# Patient Record
Sex: Female | Born: 1990 | Race: Black or African American | Hispanic: No | Marital: Single | Smoking: Former smoker
Health system: Southern US, Community
[De-identification: ages and names within clinical notes are randomized; demographics above are authoritative.]

## PROBLEM LIST (undated history)

## (undated) DIAGNOSIS — H5440 Blindness, one eye, unspecified eye: Secondary | ICD-10-CM

## (undated) DIAGNOSIS — J45909 Unspecified asthma, uncomplicated: Secondary | ICD-10-CM

## (undated) DIAGNOSIS — H53009 Unspecified amblyopia, unspecified eye: Secondary | ICD-10-CM

## (undated) HISTORY — PX: OTHER SURGICAL HISTORY: SHX169

## (undated) HISTORY — PX: EYE SURGERY: SHX253

---
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
## 2009-05-11 ENCOUNTER — Emergency Department (HOSPITAL_BASED_OUTPATIENT_CLINIC_OR_DEPARTMENT_OTHER): Admission: EM | Admit: 2009-05-11 | Discharge: 2009-05-11 | Payer: Self-pay | Admitting: Emergency Medicine

## 2009-09-17 ENCOUNTER — Emergency Department (HOSPITAL_BASED_OUTPATIENT_CLINIC_OR_DEPARTMENT_OTHER): Admission: EM | Admit: 2009-09-17 | Discharge: 2009-09-17 | Payer: Self-pay | Admitting: Emergency Medicine

## 2009-09-17 ENCOUNTER — Ambulatory Visit: Payer: Self-pay | Admitting: Diagnostic Radiology

## 2009-10-07 ENCOUNTER — Emergency Department (HOSPITAL_COMMUNITY): Admission: EM | Admit: 2009-10-07 | Discharge: 2009-10-07 | Payer: Self-pay | Admitting: Emergency Medicine

## 2009-10-08 ENCOUNTER — Emergency Department (HOSPITAL_BASED_OUTPATIENT_CLINIC_OR_DEPARTMENT_OTHER): Admission: EM | Admit: 2009-10-08 | Discharge: 2009-10-08 | Payer: Self-pay | Admitting: Emergency Medicine

## 2010-01-22 ENCOUNTER — Other Ambulatory Visit: Payer: Self-pay | Admitting: Emergency Medicine

## 2010-01-22 ENCOUNTER — Inpatient Hospital Stay (HOSPITAL_COMMUNITY): Admission: AD | Admit: 2010-01-22 | Discharge: 2010-01-22 | Payer: Self-pay | Admitting: Obstetrics and Gynecology

## 2010-03-14 ENCOUNTER — Emergency Department (HOSPITAL_BASED_OUTPATIENT_CLINIC_OR_DEPARTMENT_OTHER): Admission: EM | Admit: 2010-03-14 | Discharge: 2010-03-14 | Payer: Self-pay | Admitting: Emergency Medicine

## 2010-03-19 IMAGING — US US OB TRANSVAGINAL MODIFY
1 series · 14 of 28 positions shown · non-contrast
Comparison: None.

CLINICAL DATA: Vaginal bleeding for the past day.  6 weeks and 1
day pregnant by last menstrual period.  Positive pregnancy test.
Quantitative beta HCG 13.

OBSTETRIC <14 WK US AND TRANSVAGINAL OB US
TECHNIQUE: Both transabdominal and transvaginal ultrasound
examinations were performed for complete evaluation of the
gestation as well as the maternal uterus, adnexal regions, and
pelvic cul-de-sac.

[Series 1: us ob transvaginal modify · 0.26mm/px · 14 of 29 slices shown]
[im 2/29]
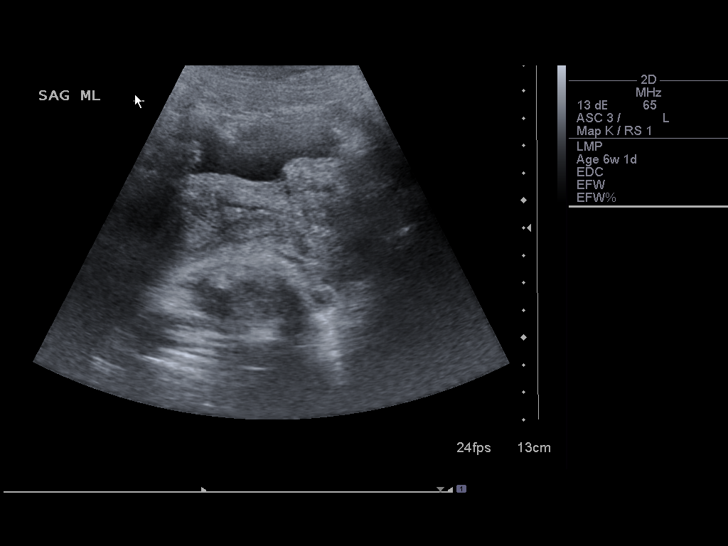
[im 4/29]
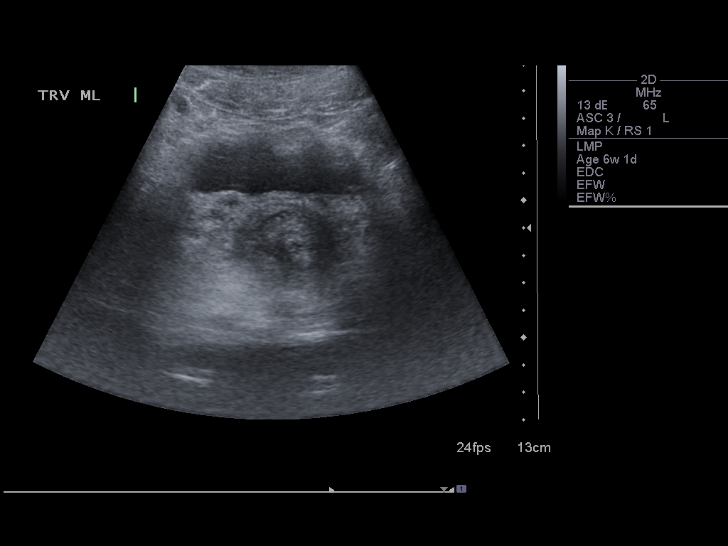
[im 6/29]
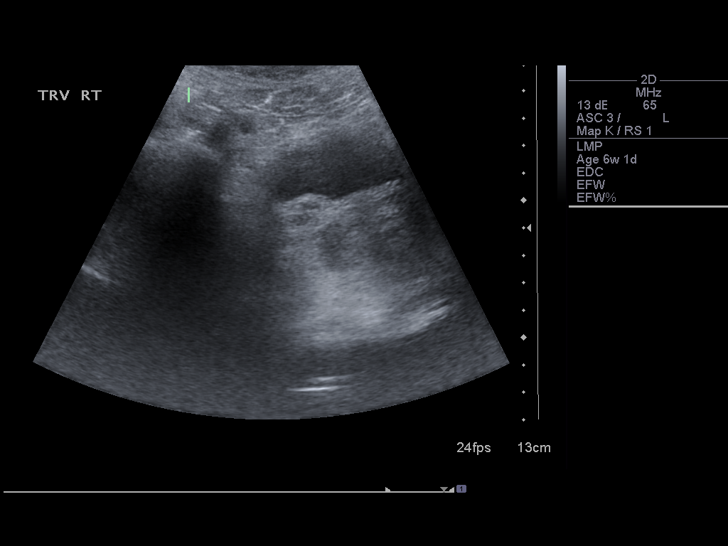
[im 8/29]
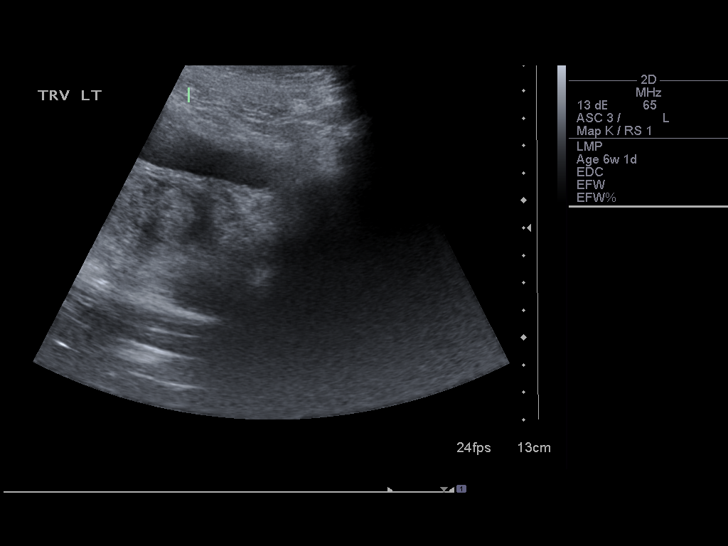
[im 10/29]
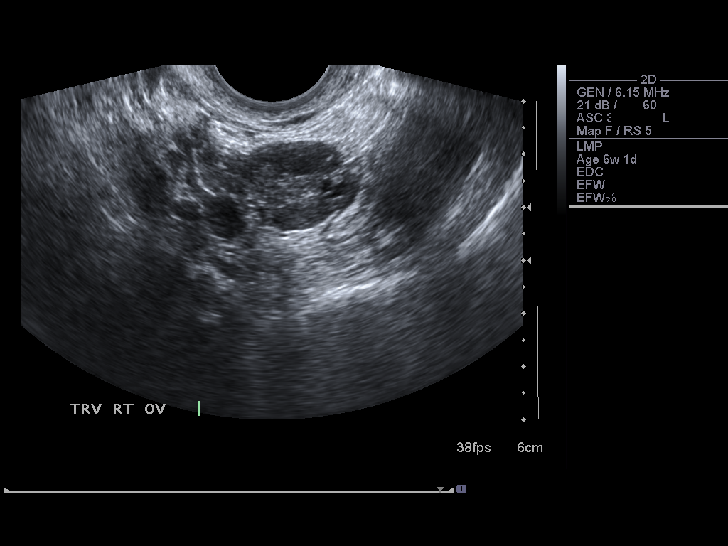
[im 12/29]
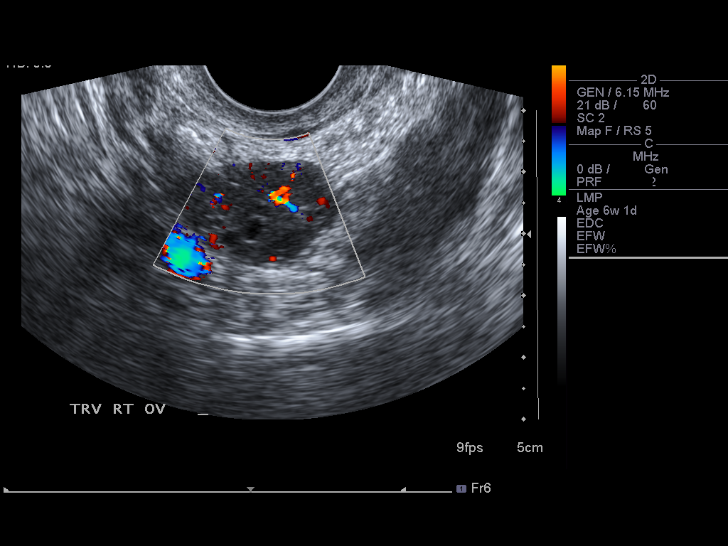
[im 14/29]
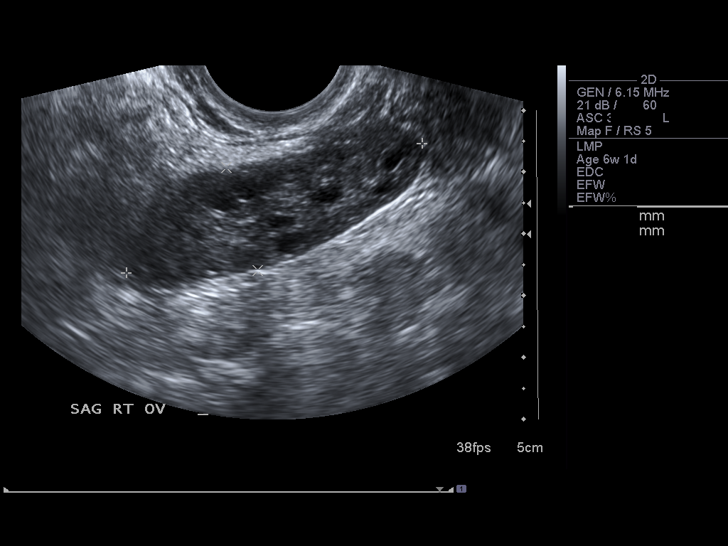
[im 16/29]
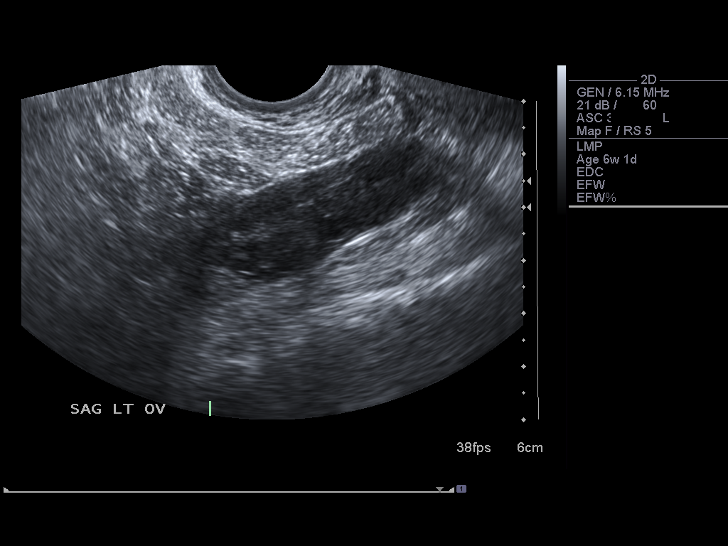
[im 18/29]
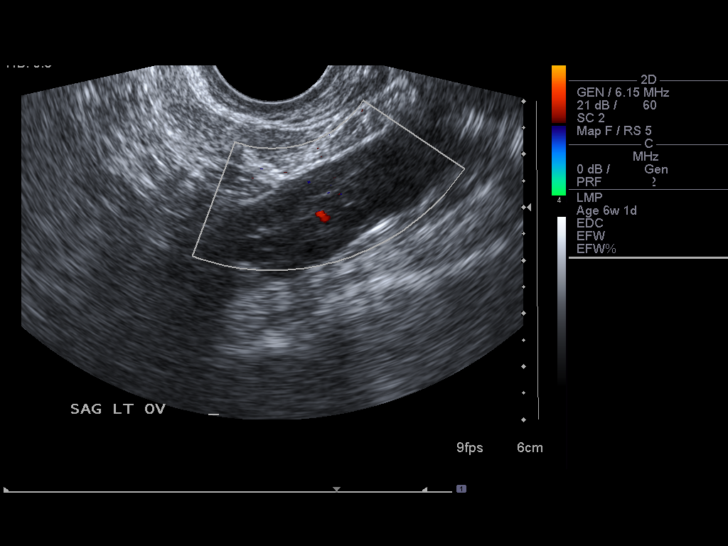
[im 20/29]
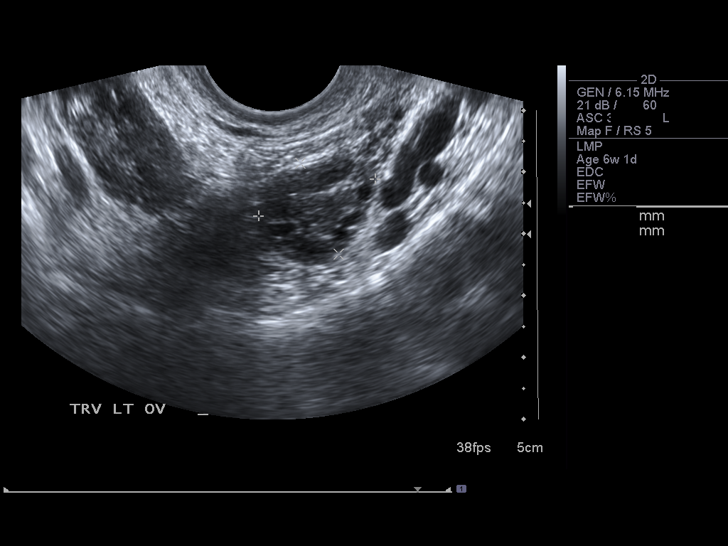
[im 22/29]
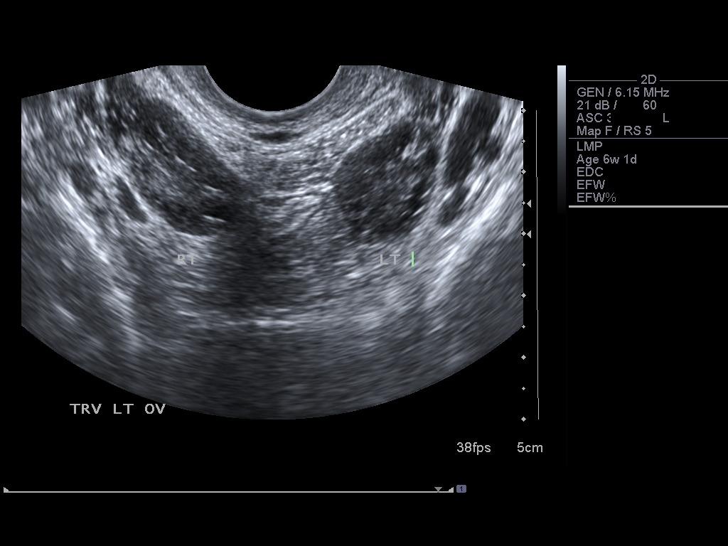
[im 24/29]
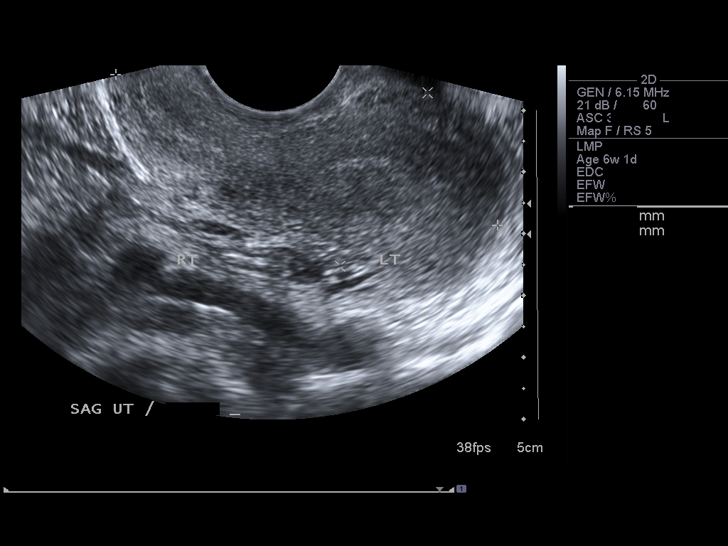
[im 26/29]
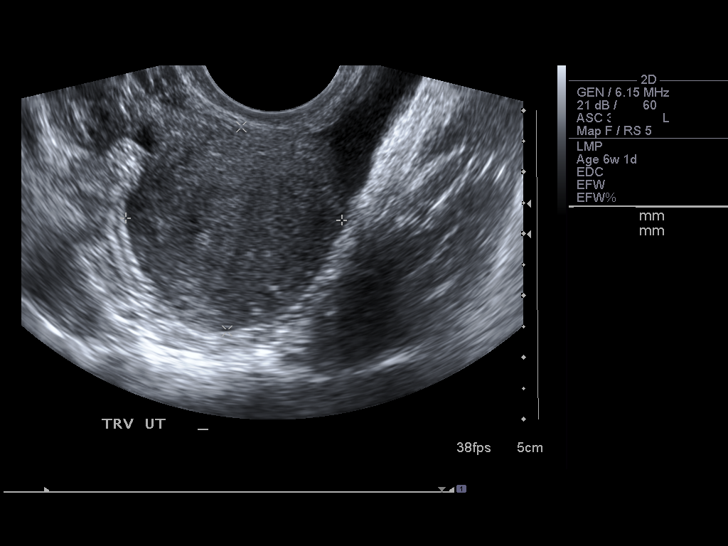
[im 29/29]
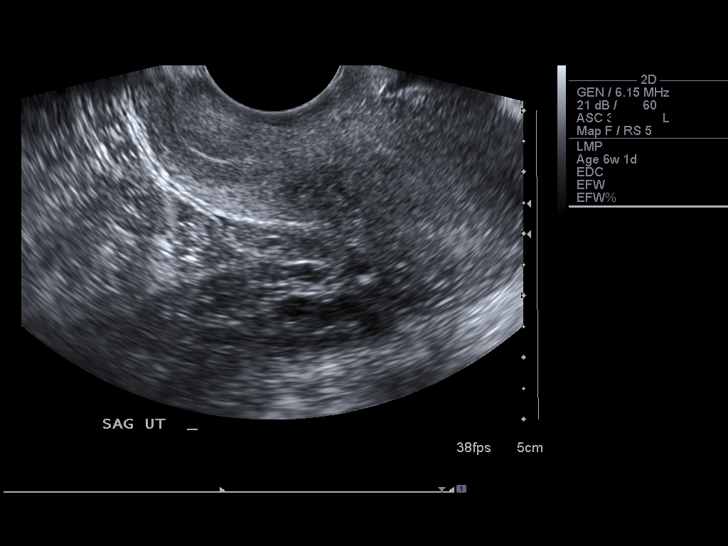

[14 of 28 positions shown; findings below may reference images not displayed]

FINDINGS: Normal appearing uterus with a normal appearing
endometrial stripe measuring 2.1 mm in maximum thickness
transvaginally.  No intrauterine or extrauterine gestational sac
seen.  Normal appearing ovaries, containing follicles.  No free
peritoneal fluid.
IMPRESSION: Normal pelvic ultrasound with no intrauterine gestation or ectopic
pregnancy seen.

## 2010-10-01 LAB — PREGNANCY, URINE: Preg Test, Ur: NEGATIVE

## 2010-10-01 LAB — URINE MICROSCOPIC-ADD ON

## 2010-10-01 LAB — URINALYSIS, ROUTINE W REFLEX MICROSCOPIC
Leukocytes, UA: NEGATIVE
Specific Gravity, Urine: 1.026 (ref 1.005–1.030)
Urobilinogen, UA: 0.2 mg/dL (ref 0.0–1.0)
pH: 6 (ref 5.0–8.0)

## 2010-10-05 ENCOUNTER — Emergency Department (HOSPITAL_BASED_OUTPATIENT_CLINIC_OR_DEPARTMENT_OTHER)
Admission: EM | Admit: 2010-10-05 | Discharge: 2010-10-05 | Disposition: A | Discharge status: Home / Self Care | Payer: Commercial Managed Care - PPO | Attending: Emergency Medicine | Admitting: Emergency Medicine

## 2010-10-05 DIAGNOSIS — F17200 Nicotine dependence, unspecified, uncomplicated: Secondary | ICD-10-CM | POA: Insufficient documentation

## 2010-10-05 DIAGNOSIS — J45909 Unspecified asthma, uncomplicated: Secondary | ICD-10-CM | POA: Insufficient documentation

## 2010-10-05 DIAGNOSIS — J029 Acute pharyngitis, unspecified: Secondary | ICD-10-CM | POA: Insufficient documentation

## 2010-10-05 LAB — RAPID STREP SCREEN (MED CTR MEBANE ONLY): Streptococcus, Group A Screen (Direct): NEGATIVE

## 2010-10-09 LAB — HERPES SIMPLEX VIRUS CULTURE: Culture: NOT DETECTED

## 2010-10-19 LAB — DIFFERENTIAL
Basophils Relative: 1 % (ref 0–1)
Eosinophils Absolute: 0.1 10*3/uL (ref 0.0–0.7)
Eosinophils Relative: 1 % (ref 0–5)
Monocytes Relative: 8 % (ref 3–12)
Neutrophils Relative %: 61 % (ref 43–77)

## 2010-10-19 LAB — CBC
Hemoglobin: 11.9 g/dL — ABNORMAL LOW (ref 12.0–15.0)
MCHC: 34 g/dL (ref 30.0–36.0)
Platelets: 425 10*3/uL — ABNORMAL HIGH (ref 150–400)
WBC: 10.5 10*3/uL (ref 4.0–10.5)

## 2010-10-19 LAB — BASIC METABOLIC PANEL
BUN: 11 mg/dL (ref 6–23)
GFR calc Af Amer: >60 mL/min (ref >60)
Sodium: 137 mEq/L (ref 135–145)

## 2010-10-25 LAB — URINE MICROSCOPIC-ADD ON

## 2010-10-25 LAB — CBC
HCT: 34.7 % — ABNORMAL LOW (ref 36.0–46.0)
MCHC: 33.7 g/dL (ref 30.0–36.0)
Platelets: 359 10*3/uL (ref 150–400)
WBC: 6.4 10*3/uL (ref 4.0–10.5)

## 2010-10-25 LAB — URINALYSIS, ROUTINE W REFLEX MICROSCOPIC
Glucose, UA: NEGATIVE mg/dL
Leukocytes, UA: NEGATIVE
Protein, ur: NEGATIVE mg/dL
Specific Gravity, Urine: 1.027 (ref 1.005–1.030)
pH: 7.5 (ref 5.0–8.0)

## 2010-10-25 LAB — DIFFERENTIAL
Basophils Absolute: 0 10*3/uL (ref 0.0–0.1)
Monocytes Relative: 8 % (ref 3–12)
Neutro Abs: 2.7 10*3/uL (ref 1.7–7.7)
Neutrophils Relative %: 43 % (ref 43–77)

## 2010-10-25 LAB — ABO/RH: ABO/RH(D): O POS

## 2010-10-25 LAB — HCG, QUANTITATIVE, PREGNANCY: hCG, Beta Chain, Quant, S: 13 m[IU]/mL — ABNORMAL HIGH (ref <5)

## 2010-10-25 LAB — WET PREP, GENITAL

## 2010-11-03 ENCOUNTER — Emergency Department (HOSPITAL_COMMUNITY): Payer: Commercial Managed Care - PPO

## 2010-11-03 ENCOUNTER — Emergency Department (HOSPITAL_COMMUNITY)
Admission: EM | Admit: 2010-11-03 | Discharge: 2010-11-03 | Disposition: A | Discharge status: Home / Self Care | Payer: Commercial Managed Care - PPO | Attending: Emergency Medicine | Admitting: Emergency Medicine

## 2010-11-03 DIAGNOSIS — Y92009 Unspecified place in unspecified non-institutional (private) residence as the place of occurrence of the external cause: Secondary | ICD-10-CM | POA: Insufficient documentation

## 2010-11-03 DIAGNOSIS — R51 Headache: Secondary | ICD-10-CM | POA: Insufficient documentation

## 2010-11-03 DIAGNOSIS — T07XXXA Unspecified multiple injuries, initial encounter: Secondary | ICD-10-CM | POA: Insufficient documentation

## 2010-11-03 DIAGNOSIS — M542 Cervicalgia: Secondary | ICD-10-CM | POA: Insufficient documentation

## 2010-11-03 DIAGNOSIS — R6884 Jaw pain: Secondary | ICD-10-CM | POA: Insufficient documentation

## 2010-11-03 DIAGNOSIS — R42 Dizziness and giddiness: Secondary | ICD-10-CM | POA: Insufficient documentation

## 2010-11-03 DIAGNOSIS — R229 Localized swelling, mass and lump, unspecified: Secondary | ICD-10-CM | POA: Insufficient documentation

## 2010-11-03 MED ORDER — IOHEXOL 300 MG/ML  SOLN
100.0000 mL | Freq: Once | INTRAMUSCULAR | Status: AC | PRN
  Administered 2010-11-03: 100 mL via INTRAVENOUS

## 2010-11-12 ENCOUNTER — Emergency Department (HOSPITAL_BASED_OUTPATIENT_CLINIC_OR_DEPARTMENT_OTHER)
Admission: EM | Admit: 2010-11-12 | Discharge: 2010-11-12 | Disposition: A | Discharge status: Home / Self Care | Payer: Commercial Managed Care - PPO | Attending: Emergency Medicine | Admitting: Emergency Medicine

## 2010-11-12 DIAGNOSIS — X58XXXA Exposure to other specified factors, initial encounter: Secondary | ICD-10-CM | POA: Insufficient documentation

## 2010-11-12 DIAGNOSIS — S20229A Contusion of unspecified back wall of thorax, initial encounter: Secondary | ICD-10-CM | POA: Insufficient documentation

## 2010-11-12 DIAGNOSIS — F17200 Nicotine dependence, unspecified, uncomplicated: Secondary | ICD-10-CM | POA: Insufficient documentation

## 2010-11-12 DIAGNOSIS — Y92009 Unspecified place in unspecified non-institutional (private) residence as the place of occurrence of the external cause: Secondary | ICD-10-CM | POA: Insufficient documentation

## 2010-12-01 NOTE — Op Note (Signed)
Cindy Arellano, Cindy Arellano                            ACCOUNT NO.:  [id number]   MEDICAL RECORD NO.:  [id number]                   PATIENT TYPE:  AMB   LOCATION:  [facility]SC                                  FACILITY:  [facility]   PHYSICIAN:  Pasty Spillers. Maple Hudson, M.D.              DATE OF BIRTH:  1991/02/12   DATE OF PROCEDURE:  07/02/2003  DATE OF DISCHARGE:                                 OPERATIVE REPORT   PREOPERATIVE DIAGNOSIS:  A pattern esotropia.   POSTOPERATIVE DIAGNOSIS:  A pattern esotropia.   PROCEDURE:  1. Right medial rectus muscle recession, 5.5 mm.  2. Superior oblique tenectomy, OU.   SURGEON:  Pasty Spillers. Maple Hudson, M.D.   ANESTHESIA:  General (laryngeal mask).   COMPLICATIONS:  None.   DESCRIPTION OF PROCEDURE:  After routine preoperative evaluation  including  informed consent from the mother, the patient was taken to the operating  room where she was identified  by me. General anesthesia was induced without  difficulty after placement  of appropriate monitors. The patient was prepped  and draped in the standard sterile fashion. A lid speculum was placed in the  right eye.   Exaggerated forced traction testing was carried out, confirming marked  tightness of the superior  oblique tendon. Through a superonasal fornix  incision through conjunctiva and Tenon's fascia, the right superior  rectus  muscle was engaged on a series of muscle hooks. A Desmarres retractor was  placed through the conjunctival incision and drawn posteriorly  to expose  the superior  oblique tendon, which was identified  along the nasal border  of the superior  rectus muscle and engaged on an oblique hook.   The tendon was spread between  2 oblique hooks, and a 4-mm section of the  tendon was excised with Westcott scissors. Force traction testing was  repeated and was completely free. The conjunctival incision was closed with  2 interrupted 6-0 Vicryl sutures.   Through an inferonasal fornix incision through  conjunctiva and Tenon's  fascia, the right  medial rectus muscle was engaged on a series of muscle  hooks and carefully cleared of its fascial attachment. The tendon was  secured with a double-armed 6-0 Vicryl suture with a double-locking bite at  each border of the muscle, 1 mm from the insertion.   The muscle was disinserted and was reattached to the sclera at a measured of  5.5 mm posterior  to the original insertion, using direct scleral passes in  crossed-swords fashion. The suture ends were tied securely after the  position of the muscle had been checked and found to be accurate. The  conjunctiva was closed with 2 interrupted 6-0 Vicryl sutures.   The lid speculum was transferred to the left eye, where a superior oblique  tenectomy was done just as described for the right, again confirming  negative forced ductions following  the tenectomy. No other muscle was  operated on the left eye. TobraDex ophthalmic ointment was placed in each  eye.   The patient was awakened without difficulty and taken to the recovery room  in stable condition. She suffered no intraoperative or immediate  postoperative complications.                                               Pasty Spillers. Maple Hudson, M.D.    Cheron Schaumann  D:  07/02/2003  T:  07/04/2003  Job:  [id number]

## 2010-12-06 ENCOUNTER — Emergency Department (HOSPITAL_BASED_OUTPATIENT_CLINIC_OR_DEPARTMENT_OTHER)
Admission: EM | Admit: 2010-12-06 | Discharge: 2010-12-07 | Disposition: A | Discharge status: Home / Self Care | Payer: Worker's Compensation | Attending: Emergency Medicine | Admitting: Emergency Medicine

## 2010-12-06 ENCOUNTER — Emergency Department (INDEPENDENT_AMBULATORY_CARE_PROVIDER_SITE_OTHER): Payer: Worker's Compensation

## 2010-12-06 DIAGNOSIS — Y9229 Other specified public building as the place of occurrence of the external cause: Secondary | ICD-10-CM | POA: Insufficient documentation

## 2010-12-06 DIAGNOSIS — W540XXA Bitten by dog, initial encounter: Secondary | ICD-10-CM | POA: Insufficient documentation

## 2010-12-06 DIAGNOSIS — S61209A Unspecified open wound of unspecified finger without damage to nail, initial encounter: Secondary | ICD-10-CM

## 2011-01-18 ENCOUNTER — Emergency Department (HOSPITAL_BASED_OUTPATIENT_CLINIC_OR_DEPARTMENT_OTHER)
Admission: EM | Admit: 2011-01-18 | Discharge: 2011-01-18 | Disposition: A | Discharge status: Home / Self Care | Payer: Commercial Managed Care - PPO | Attending: Emergency Medicine | Admitting: Emergency Medicine

## 2011-01-18 DIAGNOSIS — M25569 Pain in unspecified knee: Secondary | ICD-10-CM | POA: Insufficient documentation

## 2011-01-18 DIAGNOSIS — R1031 Right lower quadrant pain: Secondary | ICD-10-CM | POA: Insufficient documentation

## 2011-01-18 LAB — URINALYSIS, ROUTINE W REFLEX MICROSCOPIC
Bilirubin Urine: NEGATIVE
Glucose, UA: NEGATIVE mg/dL
Leukocytes, UA: NEGATIVE
Nitrite: NEGATIVE
Specific Gravity, Urine: 1.019 (ref 1.005–1.030)
pH: 7 (ref 5.0–8.0)

## 2011-01-18 LAB — PREGNANCY, URINE: Preg Test, Ur: NEGATIVE

## 2011-06-15 IMAGING — US US TRANSVAGINAL NON-OB
1 series · 14 of 25 positions shown · non-contrast
Comparison: 10/26/2008

CLINICAL DATA: Abdominal pain, vaginal bleeding; patient states
positive home pregnancy test but negative pregnancy test and quant



[Series 1: us transvaginal non-ob · 0.24mm/px · 14 of 33 slices shown]
[im 1/33]
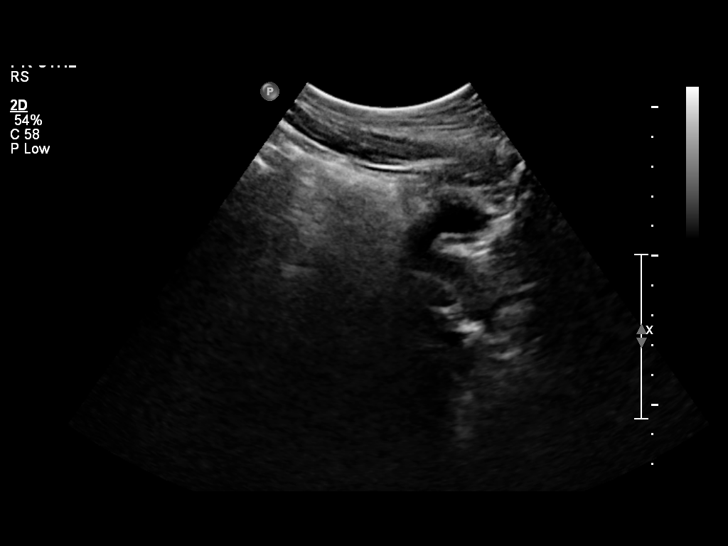
[im 3/33]
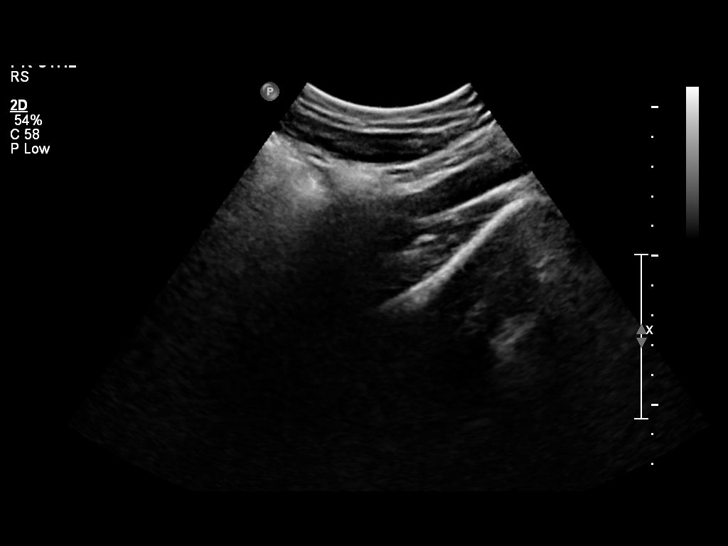
[im 6/33]
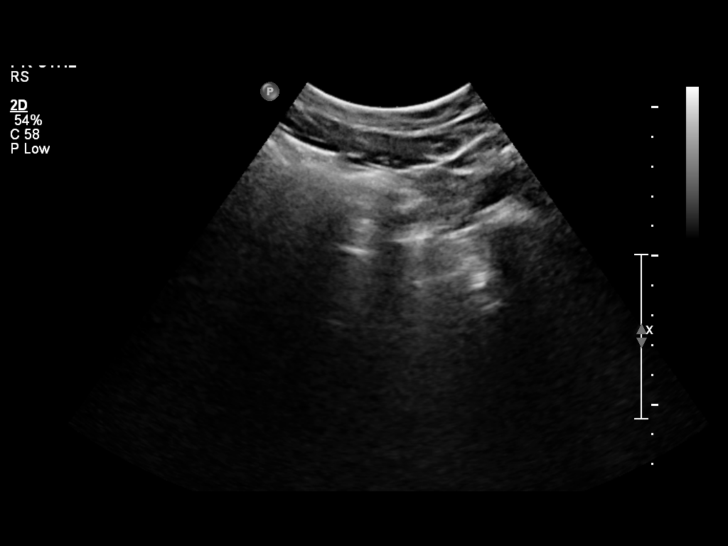
[im 9/33]
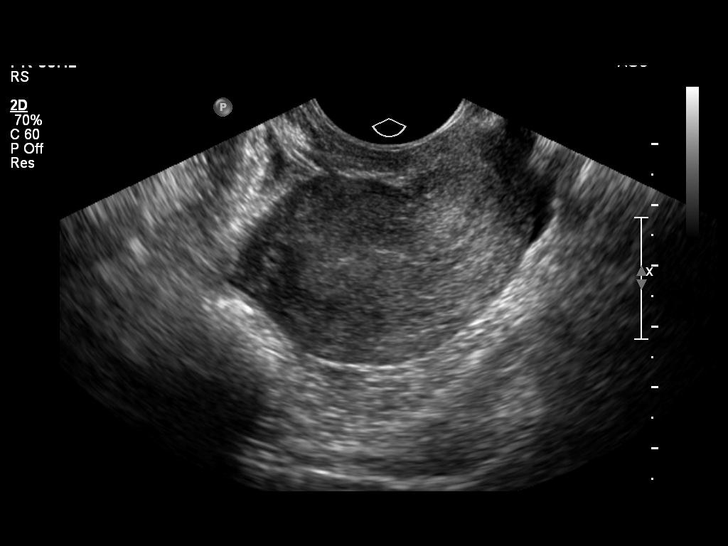
[im 11/33]
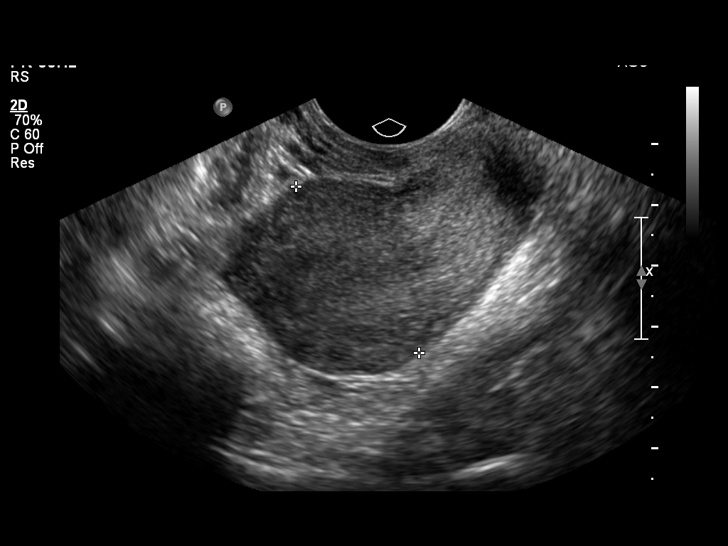
[im 13/33]
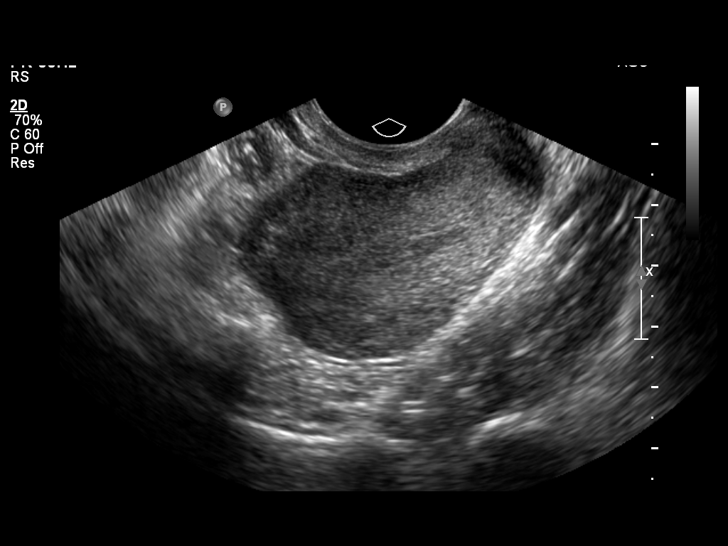
[im 15/33]
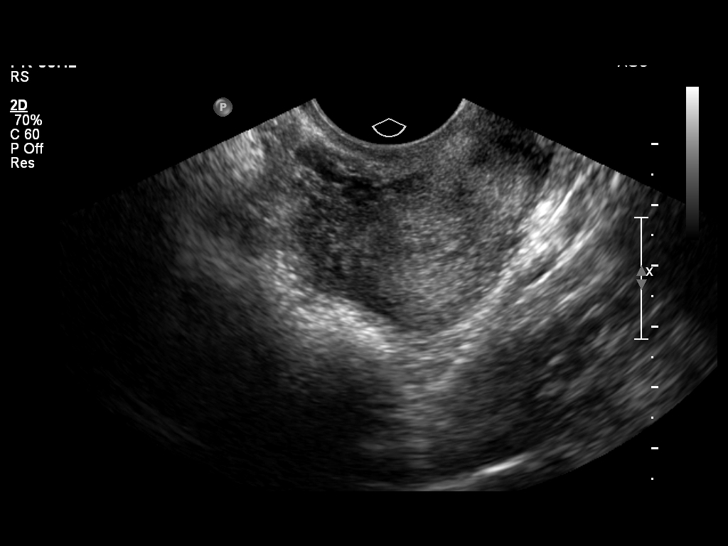
[im 18/33]
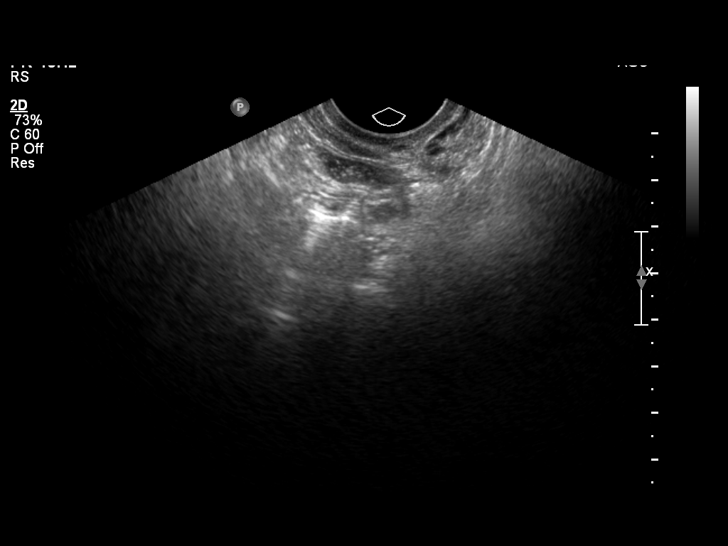
[im 21/33]
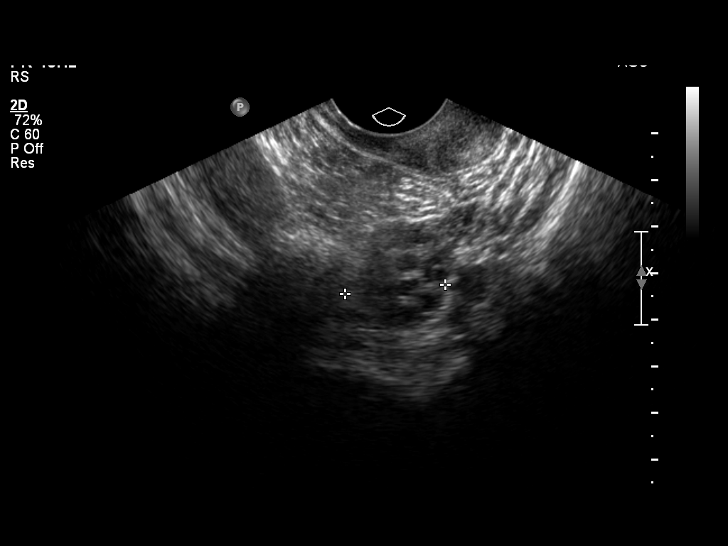
[im 22/33]
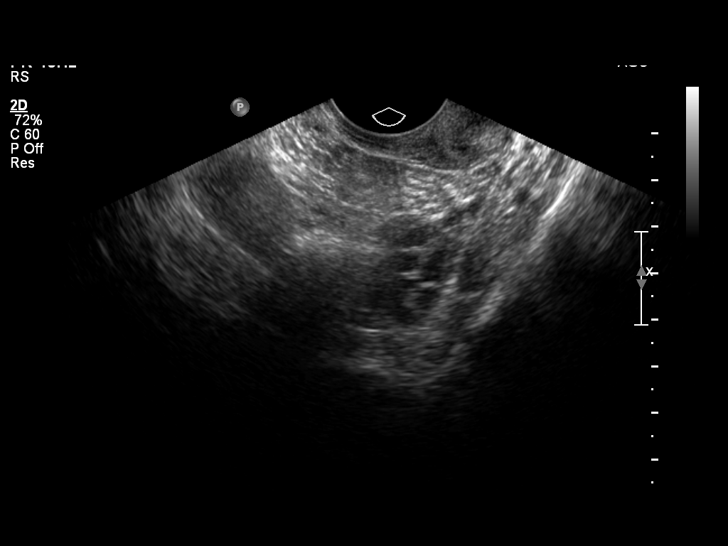
[im 25/33]
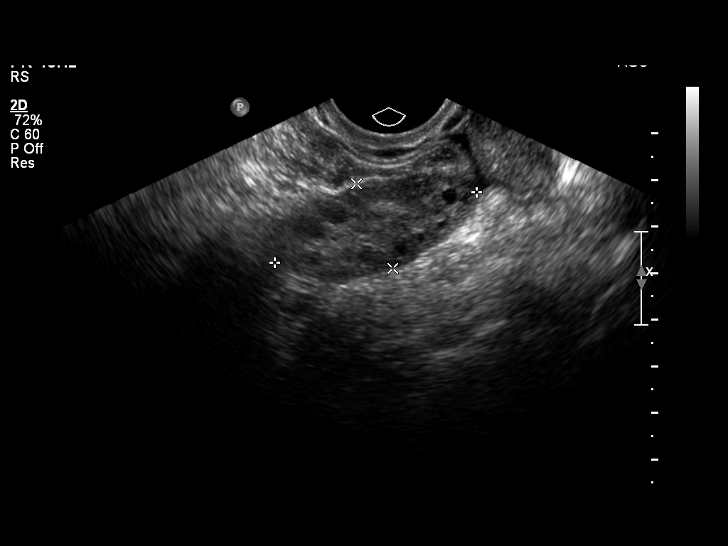
[im 27/33]
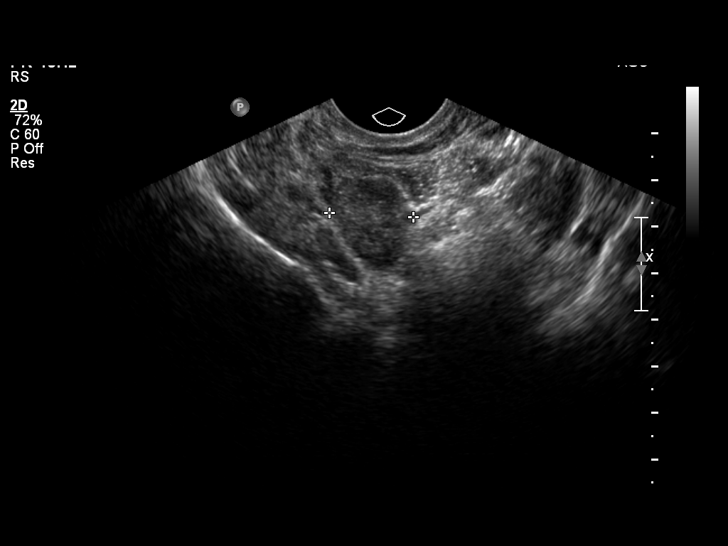
[im 30/33]
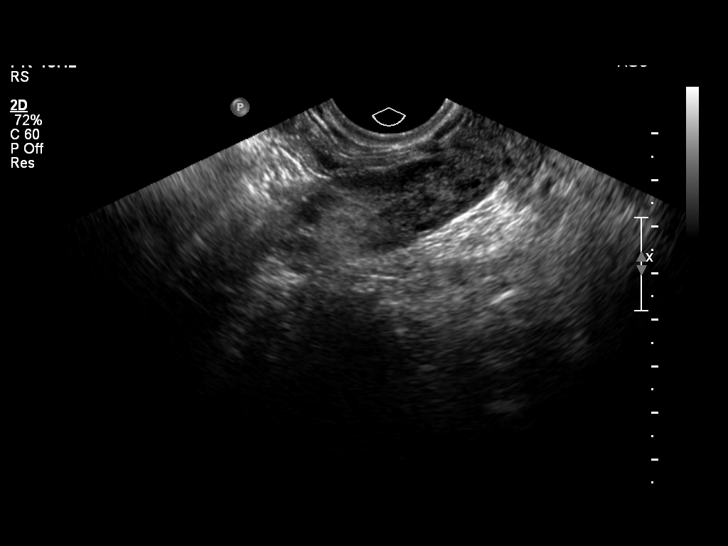
[im 33/33]
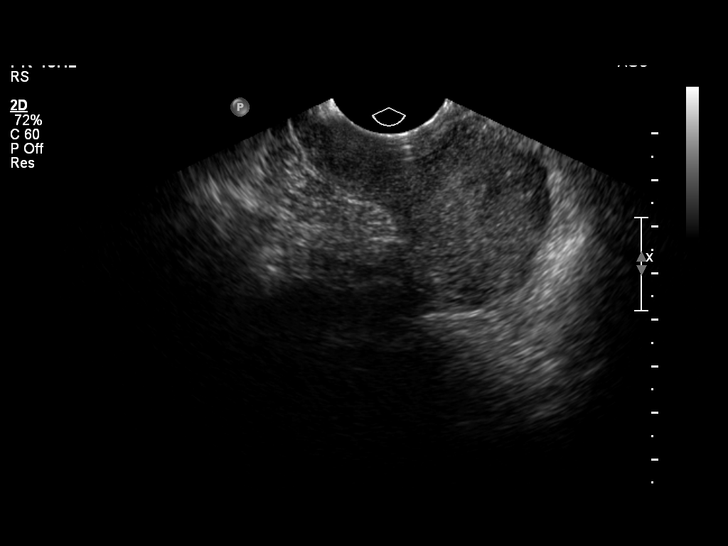

[14 of 25 positions shown; findings below may reference images not displayed]

FINDINGS: Uterus measures 6.8 cm length by 3.4 cm AP by 4.6 cm transverse.
Anteflexion of uterus.  No focal uterine mass.

Endometrium 3 mm thick, normal.  No endometrial fluid collection or
evidence of a gestational sac.

Right Ovary measures 4.6 x 2.0 x 1.8 cm.  Normal morphology without
mass.

Left Ovary measures 3.5 x 1.5 x 2.2 cm.  Normal morphology without
mass.

Other Findings:  No free pelvic fluid or adnexal masses.
IMPRESSION: Normal exam.

## 2011-07-14 ENCOUNTER — Emergency Department (HOSPITAL_BASED_OUTPATIENT_CLINIC_OR_DEPARTMENT_OTHER)
Admission: EM | Admit: 2011-07-14 | Discharge: 2011-07-15 | Disposition: A | Discharge status: Home / Self Care | Payer: Commercial Managed Care - PPO | Attending: Emergency Medicine | Admitting: Emergency Medicine

## 2011-07-14 ENCOUNTER — Encounter: Payer: Self-pay | Admitting: *Deleted

## 2011-07-14 DIAGNOSIS — F17200 Nicotine dependence, unspecified, uncomplicated: Secondary | ICD-10-CM | POA: Insufficient documentation

## 2011-07-14 DIAGNOSIS — L739 Follicular disorder, unspecified: Secondary | ICD-10-CM

## 2011-07-14 DIAGNOSIS — B9689 Other specified bacterial agents as the cause of diseases classified elsewhere: Secondary | ICD-10-CM

## 2011-07-14 DIAGNOSIS — A499 Bacterial infection, unspecified: Secondary | ICD-10-CM | POA: Insufficient documentation

## 2011-07-14 DIAGNOSIS — N760 Acute vaginitis: Secondary | ICD-10-CM

## 2011-07-14 DIAGNOSIS — L738 Other specified follicular disorders: Secondary | ICD-10-CM | POA: Insufficient documentation

## 2011-07-14 LAB — URINALYSIS, ROUTINE W REFLEX MICROSCOPIC
Bilirubin Urine: NEGATIVE
Glucose, UA: NEGATIVE mg/dL
Hgb urine dipstick: NEGATIVE
Ketones, ur: NEGATIVE mg/dL
Nitrite: NEGATIVE
Protein, ur: NEGATIVE mg/dL
Specific Gravity, Urine: 1.027 (ref 1.005–1.030)
Urobilinogen, UA: 1 mg/dL (ref 0.0–1.0)
pH: 5.5 (ref 5.0–8.0)

## 2011-07-14 LAB — URINE MICROSCOPIC-ADD ON

## 2011-07-14 LAB — WET PREP, GENITAL
Trich, Wet Prep: NONE SEEN
WBC, Wet Prep HPF POC: NONE SEEN
Yeast Wet Prep HPF POC: NONE SEEN

## 2011-07-14 LAB — PREGNANCY, URINE: Preg Test, Ur: NEGATIVE

## 2011-07-14 NOTE — ED Provider Notes (Addendum)
History  This chart was scribed for [name]. [name], MD by Bennett Scrape. This patient was seen in room MH01/MH01 and the patient's care was started at 9:45PM.  CSN: [id number]  Arrival date & time 07/14/11  2025   First MD Initiated Contact with Patient 07/14/11 2129      Chief Complaint  Patient presents with  . Vaginal Pain    Patient is a 20 y.o. female presenting with vaginal discharge. The history is provided by the patient. No language interpreter was used.  Vaginal Discharge This is a new problem. The current episode started more than 2 days ago. The problem occurs constantly. The problem has been gradually worsening. Pertinent negatives include no chest pain, no abdominal pain, no headaches and no shortness of breath. The symptoms are aggravated by nothing. The symptoms are relieved by nothing. She has tried nothing for the symptoms.    Cindy Arellano is a 20 y.o. female who presents to the Emergency Department complaining of 3 days of sudden onset, gradually worsening, constant vaginal pain with associated white vaginal discharge. She denies any modifying factors and has not tried any medications at home to improve symptoms.  Pt denies any recent antibiotic. She denies any new sexual partners. She denies being on birth control and condom use. Pt denies constipation, diarrhea, abdominal pain, nausea and vomiting as associated symptoms. Pt states that she has a h/o yeast infections with similar symptoms. Pt denies any h/o STDs. She reports that her LNMP was last week. Pt has no h/o chronic medical conditions and is not on any regular medications at home. Pt is a smoker but denies alcohol and drug use.  History reviewed. No pertinent past medical history.  Past Surgical History  Procedure Date  . Eye surgery     History reviewed. No pertinent family history.  History  Substance Use Topics  . Smoking status: Current Everyday Smoker  . Smokeless tobacco: Not on file  . Alcohol  Use: No    OB History    Grav Para Term Preterm Abortions TAB SAB Ect Mult Living                  Review of Systems  Constitutional: Positive for chills. Negative for fever.  HENT: Negative for congestion and sore throat.   Respiratory: Negative for cough and shortness of breath.   Cardiovascular: Negative for chest pain.  Gastrointestinal: Negative for nausea, vomiting, abdominal pain and diarrhea.  Genitourinary: Positive for vaginal discharge and vaginal pain. Negative for dysuria, frequency and vaginal bleeding.  Musculoskeletal: Negative for back pain.  Skin: Negative for rash.  Neurological: Negative for headaches.  All other systems reviewed and are negative.    Allergies  Review of patient's allergies indicates no known allergies.  Home Medications   Current Outpatient Rx  Name Route Sig Dispense Refill  . ALBUTEROL SULFATE HFA 108 (90 BASE) MCG/ACT IN AERS Inhalation Inhale 2 puffs into the lungs every 6 (six) hours as needed. For shortness of breath or wheezing     . CLINDAMYCIN PHOSPHATE 1 % EX GEL Topical Apply topically 2 (two) times daily. 30 g 0  . METRONIDAZOLE 500 MG PO TABS Oral Take 1 tablet (500 mg total) by mouth 2 (two) times daily. 14 tablet 0    Triage Vitals: BP 102/61  Pulse 88  Temp(Src) 98.2 F (36.8 C) (Oral)  Resp 18  Ht 5\' 10"  (1.778 m)  Wt 190 lb (86.183 kg)  BMI 27.26 kg/m2  SpO2  100%  LMP 07/07/2011  Physical Exam  Nursing note and vitals reviewed. Constitutional: She is oriented to person, place, and time. She appears well-developed and well-nourished.  HENT:  Head: Normocephalic and atraumatic.  Eyes: Conjunctivae and EOM are normal.  Neck: Normal range of motion. Neck supple. No tracheal deviation present.  Cardiovascular: Normal rate, regular rhythm and normal heart sounds.   No murmur heard. Pulmonary/Chest: Effort normal and breath sounds normal. No respiratory distress.  Abdominal: Soft. There is no tenderness.    Genitourinary:    Pelvic exam was performed with patient prone. There is tenderness on the left labia. Cervix exhibits no motion tenderness, no discharge and no friability. Right adnexum displays no mass and no tenderness. Left adnexum displays no mass and no tenderness. No erythema, tenderness or bleeding around the vagina. No foreign body around the vagina. Vaginal discharge found.  Musculoskeletal: Normal range of motion. She exhibits no edema.  Neurological: She is alert and oriented to person, place, and time. No cranial nerve deficit.  Skin: Skin is warm and dry.  Psychiatric: She has a normal mood and affect. Her behavior is normal.    ED Course  Procedures (including critical care time)  DIAGNOSTIC STUDIES: Oxygen Saturation is 100% on room air, normal by my interpretation.    COORDINATION OF CARE: 9:50PM- Discussed treatment plan with pt at bedside and pt agreed to plan. Counseled pt on smoking cessation. Pt states that she would try to quit.   Labs Reviewed  URINALYSIS, ROUTINE W REFLEX MICROSCOPIC - Abnormal; Notable for the following:    Color, Urine AMBER (*) BIOCHEMICALS MAY BE AFFECTED BY COLOR   APPearance CLOUDY (*)    Leukocytes, UA SMALL (*)    All other components within normal limits  URINE MICROSCOPIC-ADD ON - Abnormal; Notable for the following:    Squamous Epithelial / LPF MANY (*)    Bacteria, UA MANY (*)    All other components within normal limits  WET PREP, GENITAL - Abnormal; Notable for the following:    Clue Cells, Wet Prep MODERATE (*)    All other components within normal limits  PREGNANCY, URINE  GC/CHLAMYDIA PROBE AMP, GENITAL   No results found.   1. Bacterial vaginosis   2. Folliculitis       MDM  No sig pain or tenderness.  Small abscess likely from follicultis or ingrown hair from shaving along left labia majora.  Wet prep shows   Doubt GC or Chlamydia.  Pt understands results will be called to her if positive.  Pt told about  warm soaks or epsom salt baths to help cure the small folliculitis.  Will try some nafcillin as well.  UA is contaminated, doubt UTI.         I personally performed the services described in this documentation, which was scribed in my presence. The recorded information has been reviewed and considered.    12:01 AM Wet prep suggest vaginosis.    [name]. [name], MD 07/15/11 0001  [name]. Amed Datta, MD 07/15/11 0001

## 2011-07-14 NOTE — Discharge Instructions (Signed)
 Bacterial Vaginosis Bacterial vaginosis (BV) is a vaginal infection where the normal balance of bacteria in the vagina is disrupted. The normal balance is then replaced by an overgrowth of certain bacteria. There are several different kinds of bacteria that can cause BV. BV is the most common vaginal infection in women of childbearing age. CAUSES   The cause of BV is not fully understood. BV develops when there is an increase or imbalance of harmful bacteria.   Some activities or behaviors can upset the normal balance of bacteria in the vagina and put women at increased risk including:   Having a new sex partner or multiple sex partners.   Douching.   Using an intrauterine device (IUD) for contraception.   It is not clear what role sexual activity plays in the development of BV. However, women that have never had sexual intercourse are rarely infected with BV.  Women do not get BV from toilet seats, bedding, swimming pools or from touching objects around them.  SYMPTOMS   Grey vaginal discharge.   A fish-like odor with discharge, especially after sexual intercourse.   Itching or burning of the vagina and vulva.   Burning or pain with urination.   Some women have no signs or symptoms at all.  DIAGNOSIS  Your caregiver must examine the vagina for signs of BV. Your caregiver will perform lab tests and look at the sample of vaginal fluid through a microscope. They will look for bacteria and abnormal cells (clue cells), a pH test higher than 4.5, and a positive amine test all associated with BV.  RISKS AND COMPLICATIONS   Pelvic inflammatory disease (PID).   Infections following gynecology surgery.   Developing HIV.   Developing herpes virus.  TREATMENT  Sometimes BV will clear up without treatment. However, all women with symptoms of BV should be treated to avoid complications, especially if gynecology surgery is planned. Female partners generally do not need to be treated. However,  BV may spread between female sex partners so treatment is helpful in preventing a recurrence of BV.   BV may be treated with antibiotics. The antibiotics come in either pill or vaginal cream forms. Either can be used with nonpregnant or pregnant women, but the recommended dosages differ. These antibiotics are not harmful to the baby.   BV can recur after treatment. If this happens, a second round of antibiotics will often be prescribed.   Treatment is important for pregnant women. If not treated, BV can cause a premature delivery, especially for a pregnant woman who had a premature birth in the past. All pregnant women who have symptoms of BV should be checked and treated.   For chronic reoccurrence of BV, treatment with a type of prescribed gel vaginally twice a week is helpful.  HOME CARE INSTRUCTIONS   Finish all medication as directed by your caregiver.   Do not have sex until treatment is completed.   Tell your sexual partner that you have a vaginal infection. They should see their caregiver and be treated if they have problems, such as a mild rash or itching.   Practice safe sex. Use condoms. Only have 1 sex partner.  PREVENTION  Basic prevention steps can help reduce the risk of upsetting the natural balance of bacteria in the vagina and developing BV:  Do not have sexual intercourse (be abstinent).   Do not douche.   Use all of the medicine prescribed for treatment of BV, even if the signs and symptoms go away.  Tell your sex partner if you have BV. That way, they can be treated, if needed, to prevent reoccurrence.  SEEK MEDICAL CARE IF:   Your symptoms are not improving after 3 days of treatment.   You have increased discharge, pain, or fever.  MAKE SURE YOU:   Understand these instructions.   Will watch your condition.   Will get help right away if you are not doing well or get worse.  FOR MORE INFORMATION  Division of STD Prevention (DSTDP), Centers for Disease  Control and Prevention: SolutionApps.[employer].za American Social Health Association (ASHA): www.ashastd.org  Document Released: 07/02/2005 Document Revised: 03/14/2011 Document Reviewed: 12/23/2008 [facility] Patient Information 2012 [place], [place].    Folliculitis  Folliculitis is an infection and inflammation of the hair follicles. Hair follicles become red and irritated. This inflammation is usually caused by bacteria. The bacteria thrive in warm, moist environments. This condition can be seen anywhere on the body.  CAUSES The most common cause of folliculitis is an infection by germs (bacteria). Fungal and viral infections can also cause the condition. Viral infections may be more common in people whose bodies are unable to fight disease well (weakened immune systems). Examples include people with:  AIDS.   An organ transplant.   Cancer.  People with depressed immune systems, diabetes, or obesity, have a greater risk of getting folliculitis than the general population. Certain chemicals, especially oils and tars, also can cause folliculitis. SYMPTOMS  An early sign of folliculitis is a small, white or yellow pus-filled, itchy lesion (pustule). These lesions appear on a red, inflamed follicle. They are usually less than 5 mm (.20 inches).   The most likely starting points are the scalp, thighs, legs, back and buttocks. Folliculitis is also frequently found in areas of repeated shaving.   When an infection of the follicle goes deeper, it becomes a boil or furuncle. A group of closely packed boils create a larger lesion (a carbuncle). These sores (lesions) tend to occur in hairy, sweaty areas of the body.  TREATMENT   A doctor who specializes in skin problems (dermatologists) treats mild cases of folliculitis with antiseptic washes.   They also use a skin application which kills germs (topical antibiotics). Tea tree oil is a good topical antiseptic as well. It can be found at a health food store.  A small percentage of individuals may develop an allergy to the tea tree oil.   Mild to moderate boils respond well to warm water compresses applied three times daily.   In some cases, oral antibiotics should be taken with the skin treatment.   If lesions contain large quantities of pus or fluid, your caregiver may drain them. This allows the topical antibiotics to get to the affected areas better.   Stubborn cases of folliculitis may respond to laser hair removal. This process uses a high intensity light beam (a laser) to destroy the follicle and reduces the scarring from folliculitis. After laser hair removal, hair will no longer grow in the laser treated area.  Patients with long-lasting folliculitis need to find out where the infection is coming from. Germs can live in the nostrils of the patient. This can trigger an outbreak now and then. Sometimes the bacteria live in the nostrils of a family member. This person does not develop the disorder but they repeatedly re-expose others to the germ. To break the cycle of recurrence in the patient, the family member must also undergo treatment. PREVENTION   Individuals who are predisposed to folliculitis  should be extremely careful about personal hygiene.   Application of antiseptic washes may help prevent recurrences.   A topical antibiotic cream, mupirocin (Bactroban), has been effective at reducing bacteria in the nostrils. It is applied inside the nose with your little finger. This is done twice daily for a week. Then it is repeated every 6 months.   Because follicle disorders tend to come back, patients must receive follow-up care. Your caregiver may be able to recognize a recurrence before it becomes severe.  SEEK IMMEDIATE MEDICAL CARE IF:   You develop redness, swelling, or increasing pain in the area.   You have a fever.   You are not improving with treatment or are getting worse.   You have any other questions or concerns.    Document Released: 09/10/2001 Document Revised: 03/14/2011 Document Reviewed: 07/07/2008 [facility] Patient Information 2012 [place], [place].

## 2011-07-14 NOTE — ED Notes (Signed)
Pt states she has had vaginal pain and white discharge x 3 days. Worse today.

## 2011-07-15 MED ORDER — METRONIDAZOLE 500 MG PO TABS: 500.0000 mg | ORAL_TABLET | Freq: Two times a day (BID) | ORAL | Status: AC

## 2011-07-15 MED ORDER — CLINDAMYCIN PHOSPHATE 1 % EX GEL: Freq: Two times a day (BID) | CUTANEOUS | Status: DC

## 2011-07-16 LAB — GC/CHLAMYDIA PROBE AMP, GENITAL
Chlamydia, DNA Probe: NEGATIVE
GC Probe Amp, Genital: NEGATIVE

## 2011-11-26 ENCOUNTER — Encounter (HOSPITAL_BASED_OUTPATIENT_CLINIC_OR_DEPARTMENT_OTHER): Payer: Self-pay | Admitting: *Deleted

## 2011-11-26 ENCOUNTER — Emergency Department (HOSPITAL_BASED_OUTPATIENT_CLINIC_OR_DEPARTMENT_OTHER)
Admission: EM | Admit: 2011-11-26 | Discharge: 2011-11-27 | Disposition: A | Discharge status: Home / Self Care | Payer: Self-pay | Attending: Emergency Medicine | Admitting: Emergency Medicine

## 2011-11-26 DIAGNOSIS — H1130 Conjunctival hemorrhage, unspecified eye: Secondary | ICD-10-CM | POA: Insufficient documentation

## 2011-11-26 DIAGNOSIS — H5460 Unqualified visual loss, one eye, unspecified: Secondary | ICD-10-CM | POA: Insufficient documentation

## 2011-11-26 DIAGNOSIS — H1132 Conjunctival hemorrhage, left eye: Secondary | ICD-10-CM

## 2011-11-26 MED ORDER — TETRACAINE HCL 0.5 % OP SOLN
2.0000 [drp] | Freq: Once | OPHTHALMIC | Status: AC
  Administered 2011-11-27: 2 [drp] via OPHTHALMIC
  Filled 2011-11-26: qty 2

## 2011-11-26 MED ORDER — FLUORESCEIN SODIUM 1 MG OP STRP
1.0000 | ORAL_STRIP | Freq: Once | OPHTHALMIC | Status: AC
  Administered 2011-11-27: 1 via OPHTHALMIC
  Filled 2011-11-26: qty 1

## 2011-11-26 NOTE — ED Provider Notes (Signed)
History    This chart was scribed for Cindy Mazo Cindy Cords, MD, MD by Cindy Arellano. The patient was seen in room [facility] and the patient's care was started at 11:02PM.   CSN: [id number]  Arrival date & time 11/26/11  2226   First MD Initiated Contact with Patient 11/26/11 2300      Chief Complaint  Patient presents with  . Eye Problem    (Consider location/radiation/quality/duration/timing/severity/associated sxs/prior treatment) The history is provided by the patient.   Cindy Arellano is a 21 y.o. female who presents to the Emergency Department complaining of moderate visual changes onset 4 days ago.She reports that she has lost her vision in her left eye since onset.  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 She states that she was off balance starting 4 days ago initermittently but symptom has improved to normal now. Denies itching and redness in eyes. Pt has hx of eye surgery 6 years ago.  States she see a neurologist in [place] but is unsure of name or why.  No fevers,  No changes in speech,  No trauma to the eye.  No weakness or numbness.    History reviewed. No pertinent past medical history.  Past Surgical History  Procedure Date  . Eye surgery     No family history on file.  History  Substance Use Topics  . Smoking status: Current Everyday Smoker    Types: Cigars  . Smokeless tobacco: Never Used  . Alcohol Use: No     former    OB History    Grav Para Term Preterm Abortions TAB SAB Ect Mult Living                  Review of Systems  All other systems reviewed and are negative.   10 Systems reviewed and all are negative for acute change except as noted in the HPI.   Allergies  Review of patient's allergies indicates no known allergies.  Home Medications  No current outpatient prescriptions on file.  BP 117/81  Pulse 87  Temp(Src) 98.4 F (36.9 C) (Oral)  Resp 20  Ht 5\' 10"  (1.778 m)  Wt 185 lb  (83.915 kg)  BMI 26.54 kg/m2  SpO2 100%  Physical Exam  Nursing note and vitals reviewed. Constitutional: She is oriented to person, place, and time. She appears well-developed and well-nourished. No distress.  HENT:  Head: Normocephalic and atraumatic.  Eyes: EOM are normal. Pupils are equal, round, and reactive to light. Right eye exhibits no discharge. Left eye exhibits no discharge.       subconjuntiva hemorrhage at 9 oclock position on left eye   Neck: Normal range of motion. Neck supple.  Cardiovascular: Normal rate, regular rhythm and normal heart sounds.   Pulmonary/Chest: Effort normal and breath sounds normal. No respiratory distress.  Abdominal: Soft. She exhibits no distension. There is no tenderness. There is no guarding.  Musculoskeletal: Normal range of motion.       Intact reflexes  Neurological: She is alert and oriented to person, place, and time. She has normal reflexes. No cranial nerve deficit.       DTR is 5/5  Skin: Skin is warm and dry.  Psychiatric: She has a normal mood and affect.    ED Course  Procedures (including critical care time) DIAGNOSTIC STUDIES: Oxygen Saturation is 100% on room air, normal by my interpretation.    COORDINATION OF CARE: 11:03PM EDP  discusses pt ED treatment with pt    Labs Reviewed - No data to display No results found.   No diagnosis found.    MDM  Will transfer to ED at [facility] for head CT due to problems with balance and change in vision  Dr. Patria Mane aware.   I personally performed the services described in this documentation, which was scribed in my presence. The recorded information has been reviewed and considered.        Cindy Awe, MD 11/27/11 [phone]

## 2011-11-26 NOTE — ED Notes (Signed)
Left eye was 20/40 and right eye was 20/100.

## 2011-11-26 NOTE — ED Notes (Signed)
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

## 2011-11-27 ENCOUNTER — Emergency Department (HOSPITAL_COMMUNITY): Payer: Self-pay

## 2011-11-27 ENCOUNTER — Encounter (HOSPITAL_COMMUNITY): Payer: Self-pay | Admitting: Emergency Medicine

## 2011-11-27 ENCOUNTER — Emergency Department (HOSPITAL_COMMUNITY)
Admission: EM | Admit: 2011-11-27 | Discharge: 2011-11-27 | Disposition: A | Discharge status: Home / Self Care | Payer: Self-pay | Attending: Emergency Medicine | Admitting: Emergency Medicine

## 2011-11-27 DIAGNOSIS — R51 Headache: Secondary | ICD-10-CM | POA: Insufficient documentation

## 2011-11-27 DIAGNOSIS — H1130 Conjunctival hemorrhage, unspecified eye: Secondary | ICD-10-CM | POA: Insufficient documentation

## 2011-11-27 DIAGNOSIS — E869 Volume depletion, unspecified: Secondary | ICD-10-CM | POA: Insufficient documentation

## 2011-11-27 LAB — POCT I-STAT, CHEM 8
Calcium, Ion: 1.25 mmol/L (ref 1.12–1.32)
HCT: 35 % — ABNORMAL LOW (ref 36.0–46.0)
Hemoglobin: 11.9 g/dL — ABNORMAL LOW (ref 12.0–15.0)
TCO2: 23 mmol/L (ref 0–100)

## 2011-11-27 LAB — PREGNANCY, URINE: Preg Test, Ur: NEGATIVE

## 2011-11-27 MED ORDER — SODIUM CHLORIDE 0.9 % IV BOLUS (SEPSIS)
1000.0000 mL | Freq: Once | INTRAVENOUS | Status: AC
  Administered 2011-11-27: 1000 mL via INTRAVENOUS

## 2011-11-27 MED ORDER — NAPROXEN 500 MG PO TABS: 500.0000 mg | ORAL_TABLET | Freq: Two times a day (BID) | ORAL | Status: AC

## 2011-11-27 MED ORDER — KETOROLAC TROMETHAMINE 30 MG/ML IJ SOLN
30.0000 mg | Freq: Once | INTRAMUSCULAR | Status: AC
  Administered 2011-11-27: 30 mg via INTRAVENOUS
  Filled 2011-11-27: qty 1

## 2011-11-27 MED ORDER — METOCLOPRAMIDE HCL 5 MG/ML IJ SOLN
10.0000 mg | Freq: Once | INTRAMUSCULAR | Status: AC
  Administered 2011-11-27: 10 mg via INTRAVENOUS
  Filled 2011-11-27: qty 2

## 2011-11-27 MED ORDER — MORPHINE SULFATE 4 MG/ML IJ SOLN
4.0000 mg | Freq: Once | INTRAMUSCULAR | Status: AC
  Administered 2011-11-27: 4 mg via INTRAVENOUS
  Filled 2011-11-27: qty 1

## 2011-11-27 NOTE — ED Notes (Signed)
PT. TRANSFERRED FROM [facility] FOR FURTHER EVALUATION OF BILATERAL BLURRED VISION WITH PAIN AND UNSTEADY GAIT , PT. STATES " I'M LOOSING SIGHT AND OFF BALANCE" , SLIGHT EYE PAIN , DENIES INJURY , STATES PREVIOUS SURGERY TO BOTH EYES.

## 2011-11-27 NOTE — ED Notes (Signed)
Pt back from CT

## 2011-11-27 NOTE — ED Notes (Signed)
Patient transported to CT 

## 2011-11-27 NOTE — Discharge Instructions (Signed)

## 2011-11-27 NOTE — ED Notes (Signed)
Pt will be discharged from here and will go to [facility] ED for a CT scan.  Dr. Patria Mane is accepting and report was called to [facility]

## 2011-11-27 NOTE — ED Provider Notes (Signed)
History     CSN: [id number]  Arrival date & time 11/27/11  0230   First MD Initiated Contact with Patient 11/27/11 0241      Chief Complaint  Patient presents with  . Eye Problem    The history is provided by the patient.   patient reports several days of headache and intermittent episodes of what she initially referred to as losing her vision, however after additional questioning it sounds as though the patient becomes lightheaded when she stands and her vision begins to [place].  She denies melena or hematochezia.  She's had no decreased oral intake.  She does report concentrated urine.  She reports headache at this time.  She denies unilateral arm or leg weakness.  She has no numbness or tingling.  She denies chest pain shortness of breath abdominal pain.  She has no dysuria or urinary frequency.  She's had no abnormal vaginal bleeding.  Her symptoms are mild to moderate.  She was seen in outside hospital it was recommended that a CT scan be obtained but there CT scan was down and that she was sent to the emergency department for likely cranial imaging.  Her headache is worsened by light and loud noises.  She has no prior history of migraines.  She denies recent head trauma.   History reviewed. No pertinent past medical history.  Past Surgical History  Procedure Date  . Eye surgery     No family history on file.  History  Substance Use Topics  . Smoking status: Current Everyday Smoker    Types: Cigars  . Smokeless tobacco: Never Used  . Alcohol Use: No     former    OB History    Grav Para Term Preterm Abortions TAB SAB Ect Mult Living                  Review of Systems  All other systems reviewed and are negative.    Allergies  Review of patient's allergies indicates no known allergies.  Home Medications   Current Outpatient Rx  Name Route Sig Dispense Refill  . ALBUTEROL SULFATE HFA 108 (90 BASE) MCG/ACT IN AERS Inhalation Inhale 2 puffs into the lungs every 6  (six) hours as needed. For shortness of breath.    Marland Kitchen NAPROXEN 500 MG PO TABS Oral Take 1 tablet (500 mg total) by mouth 2 (two) times daily. 10 tablet 0    BP 136/74  Pulse 105  Temp(Src) 98.6 F (37 C) (Oral)  Resp 18  SpO2 99%  Physical Exam  Nursing note and vitals reviewed. Constitutional: She is oriented to person, place, and time. She appears well-developed and well-nourished. No distress.  HENT:  Head: Normocephalic and atraumatic.  Eyes: EOM are normal. Pupils are equal, round, and reactive to light.       Small sub-conjunctival hemorrhage left medial eye  Neck: Normal range of motion.  Cardiovascular: Normal rate, regular rhythm and normal heart sounds.   Pulmonary/Chest: Effort normal and breath sounds normal.  Abdominal: Soft. She exhibits no distension. There is no tenderness.  Musculoskeletal: Normal range of motion.  Neurological: She is alert and oriented to person, place, and time.       5/5 strength in major muscle groups of  bilateral upper and lower extremities. Speech normal. No facial asymetry.   Skin: Skin is warm and dry.  Psychiatric: She has a normal mood and affect. Judgment normal.    ED Course  Procedures (including critical care time)  Labs Reviewed  POCT I-STAT, CHEM 8 - Abnormal; Notable for the following:    Hemoglobin 11.9 (*)    HCT 35.0 (*)    All other components within normal limits   Ct Head Wo Contrast  11/27/2011  *RADIOLOGY REPORT*  Clinical Data: Blurry vision, headaches, and pain.  Changes worsening over the last week.  CT HEAD WITHOUT CONTRAST  Technique:  Contiguous axial images were obtained from the base of the skull through the vertex without contrast.  Comparison: 11/03/2010  Findings: The ventricles and sulci appear symmetrical.  No mass effect or midline shift.  No ventricular dilatation.  No abnormal extra-axial fluid collections.  Gray-white matter junctions are distinct.  Basal cisterns are not effaced.  No evidence of acute  intracranial hemorrhage.  No depressed skull fractures.  Visualized mastoid air cells are not opacified.  Opacification of some of the bilateral ethmoid air cells which might represent inflammatory change.  No significant changes since the previous study.  IMPRESSION: No acute intracranial abnormalities.  Original Report Authenticated By: Marlon Pel, M.D.     1. Headache   2. Volume depletion       MDM  The patient has had significant improvement in her symptoms after treatment in the emergency department with IV fluids, Reglan and Toradol.  Some of this likely represents volume depletion.  It may be a migraine component as well.  CT scan of her head was obtained and is normal.  She's not having a stroke.  Normal neurologic exam.  Discharge home in good condition.        Lyanne Co, MD 11/27/11 [phone]

## 2012-01-23 ENCOUNTER — Encounter (HOSPITAL_BASED_OUTPATIENT_CLINIC_OR_DEPARTMENT_OTHER): Payer: Self-pay | Admitting: *Deleted

## 2012-01-23 ENCOUNTER — Emergency Department (HOSPITAL_BASED_OUTPATIENT_CLINIC_OR_DEPARTMENT_OTHER)
Admission: EM | Admit: 2012-01-23 | Discharge: 2012-01-23 | Disposition: A | Discharge status: Home / Self Care | Payer: Self-pay | Attending: Emergency Medicine | Admitting: Emergency Medicine

## 2012-01-23 DIAGNOSIS — N730 Acute parametritis and pelvic cellulitis: Secondary | ICD-10-CM | POA: Insufficient documentation

## 2012-01-23 LAB — URINALYSIS, ROUTINE W REFLEX MICROSCOPIC
Glucose, UA: NEGATIVE mg/dL
Ketones, ur: NEGATIVE mg/dL
Nitrite: NEGATIVE
Protein, ur: NEGATIVE mg/dL

## 2012-01-23 LAB — URINE MICROSCOPIC-ADD ON

## 2012-01-23 LAB — WET PREP, GENITAL: Trich, Wet Prep: NONE SEEN

## 2012-01-23 MED ORDER — CEFTRIAXONE SODIUM 250 MG IJ SOLR
250.0000 mg | Freq: Once | INTRAMUSCULAR | Status: AC
  Administered 2012-01-23: 250 mg via INTRAMUSCULAR
  Filled 2012-01-23: qty 250

## 2012-01-23 MED ORDER — METRONIDAZOLE 500 MG PO TABS: 500.0000 mg | ORAL_TABLET | Freq: Two times a day (BID) | ORAL | Status: AC

## 2012-01-23 MED ORDER — DOXYCYCLINE HYCLATE 100 MG PO CAPS: 100.0000 mg | ORAL_CAPSULE | Freq: Two times a day (BID) | ORAL | Status: AC

## 2012-01-23 NOTE — ED Provider Notes (Signed)
History     CSN: [id number]  Arrival date & time 01/23/12  1451   First MD Initiated Contact with Patient 01/23/12 1514      Chief Complaint  Patient presents with  . Dysuria    (Consider location/radiation/quality/duration/timing/severity/associated sxs/prior treatment) HPI Comments: Pt presents with 2-3 day hx of lower abd cramping and burning on urination.  Has urinary frequency and urgency.  No LBP.  No fevers/chills/n/v/d.  Has had some vaginal discharge that she noticed today.  No bleeding.  Patient is a 21 y.o. female presenting with dysuria. The history is provided by the patient.  Dysuria  This is a new problem. The current episode started 2 days ago. The problem occurs every urination. The problem has been gradually worsening. The quality of the pain is described as burning. Pertinent negatives include no chills, no nausea, no vomiting, no frequency, no hematuria and no flank pain.    History reviewed. No pertinent past medical history.  Past Surgical History  Procedure Date  . Eye surgery     No family history on file.  History  Substance Use Topics  . Smoking status: Former Games developer  . Smokeless tobacco: Never Used  . Alcohol Use: No     former    OB History    Grav Para Term Preterm Abortions TAB SAB Ect Mult Living                  Review of Systems  Constitutional: Negative for fever, chills, diaphoresis and fatigue.  HENT: Negative for congestion, rhinorrhea and sneezing.   Eyes: Negative.   Respiratory: Negative for cough, chest tightness and shortness of breath.   Cardiovascular: Negative for chest pain and leg swelling.  Gastrointestinal: Negative for nausea, vomiting, abdominal pain, diarrhea and blood in stool.  Genitourinary: Positive for dysuria and vaginal discharge. Negative for frequency, hematuria, flank pain, vaginal bleeding and difficulty urinating.  Musculoskeletal: Negative for back pain and arthralgias.  Skin: Negative for rash.    Neurological: Negative for dizziness, speech difficulty, weakness, numbness and headaches.    Allergies  Review of patient's allergies indicates no known allergies.  Home Medications   Current Outpatient Rx  Name Route Sig Dispense Refill  . ALBUTEROL SULFATE HFA 108 (90 BASE) MCG/ACT IN AERS Inhalation Inhale 2 puffs into the lungs every 6 (six) hours as needed. For shortness of breath.    . DOXYCYCLINE HYCLATE 100 MG PO CAPS Oral Take 1 capsule (100 mg total) by mouth 2 (two) times daily. 20 capsule 0  . METRONIDAZOLE 500 MG PO TABS Oral Take 1 tablet (500 mg total) by mouth 2 (two) times daily. 14 tablet 0  . NAPROXEN 500 MG PO TABS Oral Take 1 tablet (500 mg total) by mouth 2 (two) times daily. 10 tablet 0    BP 118/78  Pulse 84  Temp 99 F (37.2 C) (Oral)  Resp 16  Ht 5\' 10"  (1.778 m)  Wt 185 lb (83.915 kg)  BMI 26.54 kg/m2  SpO2 100%  Physical Exam  Constitutional: She is oriented to person, place, and time. She appears well-developed and well-nourished.  HENT:  Head: Normocephalic and atraumatic.  Eyes: Pupils are equal, round, and reactive to light.  Neck: Normal range of motion. Neck supple.  Cardiovascular: Normal rate, regular rhythm and normal heart sounds.   Pulmonary/Chest: Effort normal and breath sounds normal. No respiratory distress. She has no wheezes. She has no rales. She exhibits no tenderness.  Abdominal: Soft. Bowel sounds are  normal. There is no tenderness (mild tenderness to right lower abdomen, no pain over Mcburney's point). There is no rebound and no guarding.  Genitourinary: Vaginal discharge found.       +thin white discharge with moderate CMT.  No adnexal tenderness.  Musculoskeletal: Normal range of motion. She exhibits no edema.  Lymphadenopathy:    She has no cervical adenopathy.  Neurological: She is alert and oriented to person, place, and time.  Skin: Skin is warm and dry. No rash noted.  Psychiatric: She has a normal mood and affect.     ED Course  Procedures (including critical care time)  Results for orders placed during the hospital encounter of 01/23/12  URINALYSIS, ROUTINE W REFLEX MICROSCOPIC      Component Value Range   Color, Urine YELLOW  YELLOW   APPearance CLOUDY (*) CLEAR   Specific Gravity, Urine 1.024  1.005 - 1.030   pH 6.0  5.0 - 8.0   Glucose, UA NEGATIVE  NEGATIVE mg/dL   Hgb urine dipstick MODERATE (*) NEGATIVE   Bilirubin Urine NEGATIVE  NEGATIVE   Ketones, ur NEGATIVE  NEGATIVE mg/dL   Protein, ur NEGATIVE  NEGATIVE mg/dL   Urobilinogen, UA 0.2  0.0 - 1.0 mg/dL   Nitrite NEGATIVE  NEGATIVE   Leukocytes, UA MODERATE (*) NEGATIVE  PREGNANCY, URINE      Component Value Range   Preg Test, Ur NEGATIVE  NEGATIVE  URINE MICROSCOPIC-ADD ON      Component Value Range   Squamous Epithelial / LPF RARE  RARE   WBC, UA 11-20  <3 WBC/hpf   RBC / HPF 7-10  <3 RBC/hpf   Bacteria, UA FEW (*) RARE  WET PREP, GENITAL      Component Value Range   Yeast Wet Prep HPF POC NONE SEEN  NONE SEEN   Trich, Wet Prep NONE SEEN  NONE SEEN   Clue Cells Wet Prep HPF POC FEW (*) NONE SEEN   WBC, Wet Prep HPF POC TOO NUMEROUS TO COUNT (*) NONE SEEN   No results found.   No results found.   1. PID (acute pelvic inflammatory disease)       MDM  Patient with vaginal discharge and cervical motion tenderness. We'll go ahead and treat her presumptively for PID. There is nothing to suggest a tubo-ovarian abscess. Patient does not appear to be septic or in any distress. I did encourage her to followup with the OB/GYN or the health department for ongoing management and routine Pap smears. I also advised her that she had some microscopic hematuria which she needs to have a followup urinalysis to make sure the blood clears        Rolan Bucco, MD 01/23/12 1646

## 2012-01-23 NOTE — ED Notes (Signed)
C/p urinary freq, dysuria x 2-3 days

## 2012-01-24 LAB — GC/CHLAMYDIA PROBE AMP, GENITAL
Chlamydia, DNA Probe: NEGATIVE
GC Probe Amp, Genital: NEGATIVE

## 2012-01-25 ENCOUNTER — Emergency Department (HOSPITAL_BASED_OUTPATIENT_CLINIC_OR_DEPARTMENT_OTHER)
Admission: EM | Admit: 2012-01-25 | Discharge: 2012-01-25 | Disposition: A | Discharge status: Home / Self Care | Payer: Self-pay | Attending: Emergency Medicine | Admitting: Emergency Medicine

## 2012-01-25 ENCOUNTER — Encounter (HOSPITAL_BASED_OUTPATIENT_CLINIC_OR_DEPARTMENT_OTHER): Payer: Self-pay | Admitting: Family Medicine

## 2012-01-25 DIAGNOSIS — Z87891 Personal history of nicotine dependence: Secondary | ICD-10-CM | POA: Insufficient documentation

## 2012-01-25 DIAGNOSIS — N390 Urinary tract infection, site not specified: Secondary | ICD-10-CM | POA: Insufficient documentation

## 2012-01-25 LAB — URINE MICROSCOPIC-ADD ON

## 2012-01-25 LAB — URINALYSIS, ROUTINE W REFLEX MICROSCOPIC
Bilirubin Urine: NEGATIVE
Glucose, UA: NEGATIVE mg/dL
Hgb urine dipstick: NEGATIVE
Ketones, ur: NEGATIVE mg/dL
Nitrite: NEGATIVE
Protein, ur: NEGATIVE mg/dL
Specific Gravity, Urine: 1.02 (ref 1.005–1.030)
Urobilinogen, UA: 0.2 mg/dL (ref 0.0–1.0)
pH: 6 (ref 5.0–8.0)

## 2012-01-25 MED ORDER — HYDROCODONE-ACETAMINOPHEN 5-325 MG PO TABS: 1.0000 | ORAL_TABLET | ORAL | Status: AC | PRN

## 2012-01-25 MED ORDER — CEPHALEXIN 500 MG PO CAPS: 500.0000 mg | ORAL_CAPSULE | Freq: Four times a day (QID) | ORAL | Status: AC

## 2012-01-25 MED ORDER — OXYCODONE-ACETAMINOPHEN 5-325 MG PO TABS
1.0000 | ORAL_TABLET | Freq: Once | ORAL | Status: AC
  Administered 2012-01-25: 1 via ORAL
  Filled 2012-01-25: qty 1

## 2012-01-25 MED ORDER — ONDANSETRON HCL 4 MG PO TABS: 4.0000 mg | ORAL_TABLET | Freq: Four times a day (QID) | ORAL | Status: AC

## 2012-01-25 MED ORDER — ONDANSETRON 8 MG PO TBDP
8.0000 mg | ORAL_TABLET | Freq: Once | ORAL | Status: AC
  Administered 2012-01-25: 8 mg via ORAL
  Filled 2012-01-25: qty 1

## 2012-01-25 MED ORDER — PHENAZOPYRIDINE HCL 200 MG PO TABS: 200.0000 mg | ORAL_TABLET | Freq: Three times a day (TID) | ORAL | Status: AC

## 2012-01-25 NOTE — ED Provider Notes (Signed)
History     CSN: [id number]  Arrival date & time 01/25/12  1241   First MD Initiated Contact with Patient 01/25/12 1344      Chief Complaint  Patient presents with  . Nausea    (Consider location/radiation/quality/duration/timing/severity/associated sxs/prior treatment) Patient is a 21 y.o. female presenting with abdominal pain. The history is provided by the patient.  Abdominal Pain The primary symptoms of the illness include abdominal pain, nausea, vomiting and dysuria. The primary symptoms of the illness do not include fever or vaginal discharge.  The abdominal pain began more than 2 days ago.  Associated symptoms comments: She was seen 2 days ago for same pain, dysuria and nausea. She was treated for PID with Doxycycline and Flagyl and returns today for persistent symptoms and vomiting. No fever. She denies vaginal discharge. Marland Kitchen    History reviewed. No pertinent past medical history.  Past Surgical History  Procedure Date  . Eye surgery     No family history on file.  History  Substance Use Topics  . Smoking status: Former Games developer  . Smokeless tobacco: Never Used  . Alcohol Use: No     former    OB History    Grav Para Term Preterm Abortions TAB SAB Ect Mult Living                  Review of Systems  Constitutional: Negative for fever.  Gastrointestinal: Positive for nausea, vomiting and abdominal pain.  Genitourinary: Positive for dysuria. Negative for vaginal discharge.    Allergies  Review of patient's allergies indicates no known allergies.  Home Medications   Current Outpatient Rx  Name Route Sig Dispense Refill  . ALBUTEROL SULFATE HFA 108 (90 BASE) MCG/ACT IN AERS Inhalation Inhale 2 puffs into the lungs every 6 (six) hours as needed. For shortness of breath.    . DOXYCYCLINE HYCLATE 100 MG PO CAPS Oral Take 1 capsule (100 mg total) by mouth 2 (two) times daily. 20 capsule 0  . METRONIDAZOLE 500 MG PO TABS Oral Take 1 tablet (500 mg total) by mouth  2 (two) times daily. 14 tablet 0  . NAPROXEN 500 MG PO TABS Oral Take 1 tablet (500 mg total) by mouth 2 (two) times daily. 10 tablet 0    BP 121/62  Pulse 72  Temp 97.5 F (36.4 C) (Oral)  Resp 20  Ht 5\' 10"  (1.778 m)  Wt 185 lb (83.915 kg)  BMI 26.54 kg/m2  SpO2 100%  Physical Exam  Constitutional: She appears well-developed and well-nourished. No distress.  HENT:  Head: Normocephalic.  Abdominal: Soft.       Suprapubic tenderness to palpation. No distention. ABdomen soft.   Skin: Skin is warm and dry.    ED Course  Procedures (including critical care time)  Labs Reviewed  URINALYSIS, ROUTINE W REFLEX MICROSCOPIC - Abnormal; Notable for the following:    Leukocytes, UA MODERATE (*)     All other components within normal limits  URINE MICROSCOPIC-ADD ON - Abnormal; Notable for the following:    Squamous Epithelial / LPF FEW (*)     Bacteria, UA FEW (*)     All other components within normal limits   No results found. Results for orders placed during the hospital encounter of 01/25/12  URINALYSIS, ROUTINE W REFLEX MICROSCOPIC      Component Value Range   Color, Urine YELLOW  YELLOW   APPearance CLEAR  CLEAR   Specific Gravity, Urine 1.020  1.005 - 1.030  pH 6.0  5.0 - 8.0   Glucose, UA NEGATIVE  NEGATIVE mg/dL   Hgb urine dipstick NEGATIVE  NEGATIVE   Bilirubin Urine NEGATIVE  NEGATIVE   Ketones, ur NEGATIVE  NEGATIVE mg/dL   Protein, ur NEGATIVE  NEGATIVE mg/dL   Urobilinogen, UA 0.2  0.0 - 1.0 mg/dL   Nitrite NEGATIVE  NEGATIVE   Leukocytes, UA MODERATE (*) NEGATIVE  URINE MICROSCOPIC-ADD ON      Component Value Range   Squamous Epithelial / LPF FEW (*) RARE   WBC, UA 11-20  <3 WBC/hpf   RBC / HPF 3-6  <3 RBC/hpf   Bacteria, UA FEW (*) RARE     No diagnosis found. 1. UTI    MDM  Pelvic exam deferred as it was done 2 days ago. Records reviewed. GC/Chlamydia negative. Patient has evidence of UTI on exam today - will switch abx to Keflex and give  something for pain. She has been instructed to stop Doxy and Flagyl.        Rodena Medin, PA-C 01/25/12 1423

## 2012-01-25 NOTE — ED Notes (Signed)
Pt c/o nausea and vomiting since this morning and lower abdominal pain since Monday. Pt sts she was seen here on Wed and diagnosed with "bladder infection". Pt sts she took antibiotic yesterday without getting sick but today started vomited after taking it. Pt sts she started feeling "dizzy" at 11am.

## 2012-01-26 NOTE — ED Provider Notes (Signed)
Medical screening examination/treatment/procedure(s) were performed by non-physician practitioner and as supervising physician I was immediately available for consultation/collaboration.   [name]. [name], MD 01/26/12 [phone]

## 2012-01-27 LAB — URINE CULTURE
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
## 2012-04-19 ENCOUNTER — Encounter (HOSPITAL_BASED_OUTPATIENT_CLINIC_OR_DEPARTMENT_OTHER): Payer: Self-pay | Admitting: Emergency Medicine

## 2012-04-19 ENCOUNTER — Emergency Department (HOSPITAL_BASED_OUTPATIENT_CLINIC_OR_DEPARTMENT_OTHER): Payer: Self-pay

## 2012-04-19 ENCOUNTER — Emergency Department (HOSPITAL_BASED_OUTPATIENT_CLINIC_OR_DEPARTMENT_OTHER)
Admission: EM | Admit: 2012-04-19 | Discharge: 2012-04-19 | Disposition: A | Discharge status: Home / Self Care | Payer: Self-pay | Attending: Emergency Medicine | Admitting: Emergency Medicine

## 2012-04-19 DIAGNOSIS — J45909 Unspecified asthma, uncomplicated: Secondary | ICD-10-CM | POA: Insufficient documentation

## 2012-04-19 HISTORY — DX: Unspecified asthma, uncomplicated: J45.909

## 2012-04-19 MED ORDER — IPRATROPIUM BROMIDE 0.02 % IN SOLN
0.5000 mg | Freq: Once | RESPIRATORY_TRACT | Status: AC
  Administered 2012-04-19: 0.5 mg via RESPIRATORY_TRACT

## 2012-04-19 MED ORDER — ALBUTEROL SULFATE HFA 108 (90 BASE) MCG/ACT IN AERS
2.0000 | INHALATION_SPRAY | Freq: Once | RESPIRATORY_TRACT | Status: AC
  Administered 2012-04-19: 2 via RESPIRATORY_TRACT

## 2012-04-19 MED ORDER — ALBUTEROL SULFATE (5 MG/ML) 0.5% IN NEBU
INHALATION_SOLUTION | RESPIRATORY_TRACT | Status: AC
  Administered 2012-04-19: 5 mg via RESPIRATORY_TRACT
  Filled 2012-04-19: qty 1

## 2012-04-19 MED ORDER — ALBUTEROL SULFATE (5 MG/ML) 0.5% IN NEBU
5.0000 mg | INHALATION_SOLUTION | Freq: Once | RESPIRATORY_TRACT | Status: AC
  Administered 2012-04-19: 5 mg via RESPIRATORY_TRACT

## 2012-04-19 MED ORDER — ALBUTEROL SULFATE HFA 108 (90 BASE) MCG/ACT IN AERS: 1.0000 | INHALATION_SPRAY | Freq: Four times a day (QID) | RESPIRATORY_TRACT | Status: DC | PRN

## 2012-04-19 MED ORDER — FLUTICASONE PROPIONATE HFA 110 MCG/ACT IN AERO: 1.0000 | INHALATION_SPRAY | Freq: Two times a day (BID) | RESPIRATORY_TRACT | Status: DC

## 2012-04-19 MED ORDER — ALBUTEROL SULFATE HFA 108 (90 BASE) MCG/ACT IN AERS
INHALATION_SPRAY | RESPIRATORY_TRACT | Status: AC
  Administered 2012-04-19: 2 via RESPIRATORY_TRACT
  Filled 2012-04-19: qty 6.7

## 2012-04-19 MED ORDER — IPRATROPIUM BROMIDE 0.02 % IN SOLN
RESPIRATORY_TRACT | Status: AC
  Administered 2012-04-19: 0.5 mg via RESPIRATORY_TRACT
  Filled 2012-04-19: qty 2.5

## 2012-04-19 NOTE — ED Notes (Signed)
Pt sts coughing up dark green mucous for about 3 weeks, nasal congestion - tried mucinex 2 days ago; hx asthma, short of breath this week; post tussive emesis.

## 2012-04-19 NOTE — ED Provider Notes (Signed)
History     CSN: [id number]  Arrival date & time 04/19/12  0259   First MD Initiated Contact with Patient 04/19/12 [phone]      Chief Complaint  Patient presents with  . Shortness of Breath    (Consider location/radiation/quality/duration/timing/severity/associated sxs/prior treatment) Patient is a 21 y.o. female presenting with shortness of breath. The history is provided by the patient. No language interpreter was used.  Shortness of Breath  The current episode started 2 days ago. The onset was gradual. The problem occurs continuously. The problem has been unchanged. The problem is moderate. Nothing relieves the symptoms. Nothing aggravates the symptoms. Associated symptoms include rhinorrhea, cough, shortness of breath and wheezing. Pertinent negatives include no chest pain, no chest pressure, no orthopnea, no fever and no sore throat. There was no intake of a foreign body. She has not inhaled smoke recently. Urine output has been normal. Recently, medical care has been given at this facility. Services received include medications given and tests performed.    Past Medical History  Diagnosis Date  . Asthma     Past Surgical History  Procedure Date  . Eye surgery     No family history on file.  History  Substance Use Topics  . Smoking status: Former Games developer  . Smokeless tobacco: Never Used  . Alcohol Use: No     former    OB History    Grav Para Term Preterm Abortions TAB SAB Ect Mult Living                  Review of Systems  Constitutional: Negative for fever.  HENT: Positive for rhinorrhea. Negative for sore throat.   Respiratory: Positive for cough, shortness of breath and wheezing. Negative for chest tightness.   Cardiovascular: Negative for chest pain, palpitations, orthopnea and leg swelling.  All other systems reviewed and are negative.    Allergies  Review of patient's allergies indicates no known allergies.  Home Medications   Current Outpatient Rx    Name Route Sig Dispense Refill  . ALBUTEROL SULFATE HFA 108 (90 BASE) MCG/ACT IN AERS Inhalation Inhale 2 puffs into the lungs every 6 (six) hours as needed. For shortness of breath.    Marland Kitchen NAPROXEN 500 MG PO TABS Oral Take 1 tablet (500 mg total) by mouth 2 (two) times daily. 10 tablet 0    BP 134/79  Pulse 87  Temp 98.6 F (37 C) (Oral)  Resp 16  Ht 5\' 10"  (1.778 m)  Wt 180 lb (81.647 kg)  BMI 25.83 kg/m2  SpO2 100%  Physical Exam  Constitutional: She is oriented to person, place, and time. She appears well-developed and well-nourished. No distress.  HENT:  Head: Normocephalic and atraumatic.  Mouth/Throat: Oropharynx is clear and moist.  Eyes: Conjunctivae normal are normal. Pupils are equal, round, and reactive to light.  Neck: Normal range of motion. Neck supple.  Cardiovascular: Normal rate and regular rhythm.   Pulmonary/Chest: Effort normal. No respiratory distress. She has wheezes. She has no rales. She exhibits no tenderness.  Abdominal: Soft. Bowel sounds are normal. There is no tenderness. There is no rebound and no guarding.  Musculoskeletal: Normal range of motion.  Neurological: She is alert and oriented to person, place, and time.  Skin: Skin is warm and dry.  Psychiatric: She has a normal mood and affect.    ED Course  Procedures (including critical care time)   Labs Reviewed  PREGNANCY, URINE   No results found.   No  diagnosis found.   PERC negative Wells negative.  MDM  Improved aeration post nebulizer.  Patient feels improved.  Will d/c with Rx for inhalers.  Suspect post nasal drip draining down throat causing cough. Return immediately for fevers chills chest pain or worsening SOB       Kelyse Pask K Providence Stivers-Rasch, MD 04/19/12 0530

## 2012-04-19 NOTE — Patient Instructions (Signed)
Pt instructed on the proper use of administering albuteral mdi via aerochamber pt tolerated well. 

## 2012-04-20 ENCOUNTER — Emergency Department (HOSPITAL_BASED_OUTPATIENT_CLINIC_OR_DEPARTMENT_OTHER)
Admission: EM | Admit: 2012-04-20 | Discharge: 2012-04-20 | Disposition: A | Discharge status: Home / Self Care | Payer: Self-pay | Attending: Emergency Medicine | Admitting: Emergency Medicine

## 2012-04-20 ENCOUNTER — Encounter (HOSPITAL_BASED_OUTPATIENT_CLINIC_OR_DEPARTMENT_OTHER): Payer: Self-pay | Admitting: *Deleted

## 2012-04-20 DIAGNOSIS — R0602 Shortness of breath: Secondary | ICD-10-CM | POA: Insufficient documentation

## 2012-04-20 DIAGNOSIS — R079 Chest pain, unspecified: Secondary | ICD-10-CM | POA: Insufficient documentation

## 2012-04-20 NOTE — ED Notes (Signed)
Pt states she was seen here yesterday and was supposed to go to work at 0600 this morning. States she "woke up feeling bad and used her inhaler and fell back to sleep, waking up just now" Needs note for work.

## 2012-06-04 ENCOUNTER — Emergency Department (HOSPITAL_BASED_OUTPATIENT_CLINIC_OR_DEPARTMENT_OTHER)
Admission: EM | Admit: 2012-06-04 | Discharge: 2012-06-04 | Disposition: A | Discharge status: Home / Self Care | Payer: Self-pay | Attending: Emergency Medicine | Admitting: Emergency Medicine

## 2012-06-04 ENCOUNTER — Encounter (HOSPITAL_BASED_OUTPATIENT_CLINIC_OR_DEPARTMENT_OTHER): Payer: Self-pay | Admitting: Emergency Medicine

## 2012-06-04 DIAGNOSIS — B009 Herpesviral infection, unspecified: Secondary | ICD-10-CM | POA: Insufficient documentation

## 2012-06-04 DIAGNOSIS — Z7251 High risk heterosexual behavior: Secondary | ICD-10-CM | POA: Insufficient documentation

## 2012-06-04 DIAGNOSIS — J45909 Unspecified asthma, uncomplicated: Secondary | ICD-10-CM | POA: Insufficient documentation

## 2012-06-04 DIAGNOSIS — Z79899 Other long term (current) drug therapy: Secondary | ICD-10-CM | POA: Insufficient documentation

## 2012-06-04 DIAGNOSIS — F17200 Nicotine dependence, unspecified, uncomplicated: Secondary | ICD-10-CM | POA: Insufficient documentation

## 2012-06-04 LAB — WET PREP, GENITAL
Trich, Wet Prep: NONE SEEN
Yeast Wet Prep HPF POC: NONE SEEN

## 2012-06-04 LAB — PREGNANCY, URINE: Preg Test, Ur: NEGATIVE

## 2012-06-04 MED ORDER — ACYCLOVIR 400 MG PO TABS: 400.0000 mg | ORAL_TABLET | Freq: Three times a day (TID) | ORAL | Status: DC

## 2012-06-04 NOTE — ED Notes (Signed)
Pt c/o bumps in genital area

## 2012-06-04 NOTE — ED Provider Notes (Addendum)
History     CSN: [id number]  Arrival date & time 06/04/12  2841   First MD Initiated Contact with Patient 06/04/12 [phone]      Chief Complaint  Patient presents with  . SEXUALLY TRANSMITTED DISEASE    (Consider location/radiation/quality/duration/timing/severity/associated sxs/prior treatment) Patient is a 21 y.o. female presenting with STD exposure and rash. The history is provided by the patient. No language interpreter was used.  Exposure to STD This is a chronic problem. The current episode started more than 1 week ago. Episode frequency: unknown. The problem has been gradually worsening. Pertinent negatives include no chest pain and no abdominal pain. Nothing aggravates the symptoms. Nothing relieves the symptoms. She has tried nothing for the symptoms. The treatment provided no relief.  Rash  This is a recurrent problem. The current episode started more than 1 week ago. The problem has not changed since onset.The problem is associated with nothing. There has been no fever. Affected Location: genitals. The pain is severe. The pain has been constant since onset. Associated symptoms include blisters. Pertinent negatives include no weeping. She has tried nothing for the symptoms. The treatment provided no relief.  States she has had this painful labial cluster of lesions in the past and [facility] but they ulcerate and become painful.  This episode started 2 days ago.  Is not sure which partner she has had that has herpes but believes the lesions to be herpetic.    Past Medical History  Diagnosis Date  . Asthma     Past Surgical History  Procedure Date  . Eye surgery     No family history on file.  History  Substance Use Topics  . Smoking status: Current Every Day Smoker  . Smokeless tobacco: Never Used  . Alcohol Use: No     Comment: former    OB History    Grav Para Term Preterm Abortions TAB SAB Ect Mult Living                   Review of Systems  Cardiovascular: Negative for chest pain.  Gastrointestinal: Negative for abdominal pain.  Skin: Positive for rash.  All other systems reviewed and are negative.    Allergies  Review of patient's allergies indicates no known allergies.  Home Medications   Current Outpatient Rx  Name  Route  Sig  Dispense  Refill  . ALBUTEROL SULFATE HFA 108 (90 BASE) MCG/ACT IN AERS   Inhalation   Inhale 1-2 puffs into the lungs every 6 (six) hours as needed for wheezing.   1 Inhaler   0   . FLUTICASONE PROPIONATE  HFA 110 MCG/ACT IN AERO   Inhalation   Inhale 1 puff into the lungs 2 (two) times daily.   1 Inhaler   12   . ALBUTEROL SULFATE HFA 108 (90 BASE) MCG/ACT IN AERS   Inhalation   Inhale 2 puffs into the lungs every 6 (six) hours as needed. For shortness of breath.         Marland Kitchen NAPROXEN 500 MG PO TABS   Oral   Take 1 tablet (500 mg total) by mouth 2 (two) times daily.   10 tablet   0     BP 122/75  Pulse 97  Temp 98.8 F (37.1 C) (Oral)  Resp 18  SpO2 100%  Physical Exam  Constitutional: She is oriented to person, place, and time. She appears well-developed and well-nourished. No distress.  HENT:  Head:  Normocephalic and atraumatic.  Mouth/Throat: Oropharynx is clear and moist.  Eyes: Conjunctivae normal are normal. Pupils are equal, round, and reactive to light.  Neck: Normal range of motion. Neck supple.  Cardiovascular: Normal rate and regular rhythm.   Pulmonary/Chest: Effort normal and breath sounds normal.  Abdominal: Soft. Bowel sounds are normal. There is no tenderness. There is no rebound and no guarding.  Genitourinary:    Vaginal discharge found.       Chaperone present  Musculoskeletal: Normal range of motion.  Neurological: She is alert and oriented to person, place, and time.  Skin: Skin is warm and dry.  Psychiatric: She has a normal mood and affect.    ED Course  Procedures (including critical care time)   Labs  Reviewed  PREGNANCY, URINE  WET PREP, GENITAL  GC/CHLAMYDIA PROBE AMP   No results found.   No diagnosis found.    MDM  Follow up at [facility] health department for ongoing care.  Vaginal discharge without associated symptoms.  EDP recommended treatment for GC/ Chlamydia but patient wants to wait for definitive culture results in 72 hours she will be called if positive.   Will prescribe acyclovir    As patient states she has had these painful lesions at least 4-5 times in the past will prescribe acyclovir as if is a recurrence and not first time outbreak    Xanthe Couillard K Blaine Hari-Rasch, MD 06/04/12 0441  Jaelynne Hockley K Alakai Macbride-Rasch, MD 06/04/12 [phone]

## 2012-06-25 ENCOUNTER — Emergency Department (HOSPITAL_BASED_OUTPATIENT_CLINIC_OR_DEPARTMENT_OTHER): Payer: Worker's Compensation

## 2012-06-25 ENCOUNTER — Emergency Department (HOSPITAL_BASED_OUTPATIENT_CLINIC_OR_DEPARTMENT_OTHER)
Admission: EM | Admit: 2012-06-25 | Discharge: 2012-06-25 | Disposition: A | Discharge status: Home / Self Care | Payer: Worker's Compensation | Attending: Emergency Medicine | Admitting: Emergency Medicine

## 2012-06-25 ENCOUNTER — Encounter (HOSPITAL_BASED_OUTPATIENT_CLINIC_OR_DEPARTMENT_OTHER): Payer: Self-pay | Admitting: Family Medicine

## 2012-06-25 DIAGNOSIS — F17200 Nicotine dependence, unspecified, uncomplicated: Secondary | ICD-10-CM | POA: Insufficient documentation

## 2012-06-25 DIAGNOSIS — Y990 Civilian activity done for income or pay: Secondary | ICD-10-CM | POA: Insufficient documentation

## 2012-06-25 DIAGNOSIS — IMO0002 Reserved for concepts with insufficient information to code with codable children: Secondary | ICD-10-CM | POA: Insufficient documentation

## 2012-06-25 DIAGNOSIS — Y9389 Activity, other specified: Secondary | ICD-10-CM | POA: Insufficient documentation

## 2012-06-25 DIAGNOSIS — S6390XA Sprain of unspecified part of unspecified wrist and hand, initial encounter: Secondary | ICD-10-CM | POA: Insufficient documentation

## 2012-06-25 DIAGNOSIS — T148XXA Other injury of unspecified body region, initial encounter: Secondary | ICD-10-CM

## 2012-06-25 DIAGNOSIS — Z79899 Other long term (current) drug therapy: Secondary | ICD-10-CM | POA: Insufficient documentation

## 2012-06-25 DIAGNOSIS — W230XXA Caught, crushed, jammed, or pinched between moving objects, initial encounter: Secondary | ICD-10-CM | POA: Insufficient documentation

## 2012-06-25 DIAGNOSIS — Z23 Encounter for immunization: Secondary | ICD-10-CM | POA: Insufficient documentation

## 2012-06-25 DIAGNOSIS — J45909 Unspecified asthma, uncomplicated: Secondary | ICD-10-CM | POA: Insufficient documentation

## 2012-06-25 DIAGNOSIS — Y9269 Other specified industrial and construction area as the place of occurrence of the external cause: Secondary | ICD-10-CM | POA: Insufficient documentation

## 2012-06-25 MED ORDER — TETANUS-DIPHTH-ACELL PERTUSSIS 5-2.5-18.5 LF-MCG/0.5 IM SUSP
0.5000 mL | Freq: Once | INTRAMUSCULAR | Status: AC
  Administered 2012-06-25: 0.5 mL via INTRAMUSCULAR
  Filled 2012-06-25: qty 0.5

## 2012-06-25 NOTE — ED Provider Notes (Signed)
History     CSN: [id number]  Arrival date & time 06/25/12  1152   First MD Initiated Contact with Patient 06/25/12 1210      Chief Complaint  Patient presents with  . Finger Injury    (Consider location/radiation/quality/duration/timing/severity/associated sxs/prior treatment) HPI Comments: Pt states that this morning at work she got her finger stuck in a mixer:pt states that she has no previous injury to the left pinky finger:pt c/o abrasion to the area but also swelling at the proximal joint:pt states that she can move without any problem:pt states that she is also have some pain in the proximal forth digit as well:pt denies numbness or weakness  The history is provided by the patient. No language interpreter was used.    Past Medical History  Diagnosis Date  . Asthma     Past Surgical History  Procedure Date  . Eye surgery     No family history on file.  History  Substance Use Topics  . Smoking status: Current Every Day Smoker  . Smokeless tobacco: Never Used  . Alcohol Use: No     Comment: former    OB History    Grav Para Term Preterm Abortions TAB SAB Ect Mult Living                  Review of Systems  Constitutional: Negative.   Respiratory: Negative.   Cardiovascular: Negative.     Allergies  Review of patient's allergies indicates no known allergies.  Home Medications   Current Outpatient Rx  Name  Route  Sig  Dispense  Refill  . ACYCLOVIR 400 MG PO TABS   Oral   Take 1 tablet (400 mg total) by mouth 3 (three) times daily.   15 tablet   0   . ALBUTEROL SULFATE HFA 108 (90 BASE) MCG/ACT IN AERS   Inhalation   Inhale 2 puffs into the lungs every 6 (six) hours as needed. For shortness of breath.         . ALBUTEROL SULFATE HFA 108 (90 BASE) MCG/ACT IN AERS   Inhalation   Inhale 1-2 puffs into the lungs every 6 (six) hours as needed for wheezing.   1 Inhaler   0   . FLUTICASONE PROPIONATE  HFA 110 MCG/ACT IN AERO   Inhalation  Inhale 1 puff into the lungs 2 (two) times daily.   1 Inhaler   12   . NAPROXEN 500 MG PO TABS   Oral   Take 1 tablet (500 mg total) by mouth 2 (two) times daily.   10 tablet   0     BP 140/76  Pulse 91  Temp 98.2 F (36.8 C) (Oral)  Resp 16  SpO2 100%  Physical Exam  Nursing note and vitals reviewed. Constitutional: She is oriented to person, place, and time. She appears well-developed and well-nourished.  Cardiovascular: Normal rate and regular rhythm.   Pulmonary/Chest: Effort normal and breath sounds normal.  Musculoskeletal:       Swelling noted to the proximal fifth left fifth digit  Neurological: She is alert and oriented to person, place, and time. Coordination normal.  Skin:       Abrasion noted to the medial aspect of the proximal left fifth digit    ED Course  Procedures (including critical care time)  Labs Reviewed - No data to display Dg Hand Complete Left  06/25/2012  *RADIOLOGY REPORT*  Clinical Data: Finger injury, pain.  Finger laceration to fifth digit.  LEFT HAND - COMPLETE 3+ VIEW  Comparison: None.  Findings: No acute bony abnormality.  Specifically, no fracture, subluxation, or dislocation.  Soft tissues are intact.  IMPRESSION: No acute bony abnormality.   Original Report Authenticated By: Charlett Nose, M.D.      1. Finger strain   2. Abrasion       MDM  No sign of fracture noted:pt splinted for comfort        Teressa Lower, NP 06/25/12 1343

## 2012-06-25 NOTE — ED Provider Notes (Signed)
Medical screening examination/treatment/procedure(s) were performed by non-physician practitioner and as supervising physician I was immediately available for consultation/collaboration.   Rolan Bucco, MD 06/25/12 1451

## 2012-06-25 NOTE — ED Notes (Signed)
Pt sts she got pinky finger on left hand caught in a mixer at work at [employer] today. Pt needs UDS performed. Swelling noted to digit.

## 2012-06-30 ENCOUNTER — Emergency Department (HOSPITAL_BASED_OUTPATIENT_CLINIC_OR_DEPARTMENT_OTHER)
Admission: EM | Admit: 2012-06-30 | Discharge: 2012-06-30 | Disposition: A | Discharge status: Home / Self Care | Payer: Worker's Compensation | Attending: Emergency Medicine | Admitting: Emergency Medicine

## 2012-06-30 ENCOUNTER — Encounter (HOSPITAL_BASED_OUTPATIENT_CLINIC_OR_DEPARTMENT_OTHER): Payer: Self-pay | Admitting: *Deleted

## 2012-06-30 DIAGNOSIS — Y929 Unspecified place or not applicable: Secondary | ICD-10-CM | POA: Insufficient documentation

## 2012-06-30 DIAGNOSIS — J45909 Unspecified asthma, uncomplicated: Secondary | ICD-10-CM | POA: Insufficient documentation

## 2012-06-30 DIAGNOSIS — Y939 Activity, unspecified: Secondary | ICD-10-CM | POA: Insufficient documentation

## 2012-06-30 DIAGNOSIS — Z79899 Other long term (current) drug therapy: Secondary | ICD-10-CM | POA: Insufficient documentation

## 2012-06-30 DIAGNOSIS — F17200 Nicotine dependence, unspecified, uncomplicated: Secondary | ICD-10-CM | POA: Insufficient documentation

## 2012-06-30 DIAGNOSIS — S6000XA Contusion of unspecified finger without damage to nail, initial encounter: Secondary | ICD-10-CM | POA: Insufficient documentation

## 2012-06-30 DIAGNOSIS — X58XXXA Exposure to other specified factors, initial encounter: Secondary | ICD-10-CM | POA: Insufficient documentation

## 2012-06-30 NOTE — ED Notes (Signed)
Pt here for recheck of left 5th finger injury

## 2012-06-30 NOTE — ED Notes (Signed)
D/c home- note to return to work given to pt- s/s of wound infection discussed

## 2012-06-30 NOTE — ED Provider Notes (Signed)
History     CSN: [id number]  Arrival date & time 06/30/12  1526   First MD Initiated Contact with Patient 06/30/12 1638      Chief Complaint  Patient presents with  . Wound Check    (Consider location/radiation/quality/duration/timing/severity/associated sxs/prior treatment) Patient is a 21 y.o. female presenting with wound check. The history is provided by the patient. No language interpreter was used.  Wound Check  Treated in ED: 5 days ago. Prior ED Treatment: laceration. There has been no drainage from the wound. There is no redness present. There is no swelling present. The pain has improved. She has no difficulty moving the affected extremity or digit.  Pt needs a note to return to regular work.   Pt reports finger is feeling better  Past Medical History  Diagnosis Date  . Asthma     Past Surgical History  Procedure Date  . Eye surgery     History reviewed. No pertinent family history.  History  Substance Use Topics  . Smoking status: Current Every Day Smoker  . Smokeless tobacco: Never Used  . Alcohol Use: No     Comment: former    OB History    Grav Para Term Preterm Abortions TAB SAB Ect Mult Living                  Review of Systems  All other systems reviewed and are negative.    Allergies  Review of patient's allergies indicates no known allergies.  Home Medications   Current Outpatient Rx  Name  Route  Sig  Dispense  Refill  . ACYCLOVIR 400 MG PO TABS   Oral   Take 1 tablet (400 mg total) by mouth 3 (three) times daily.   15 tablet   0   . ALBUTEROL SULFATE HFA 108 (90 BASE) MCG/ACT IN AERS   Inhalation   Inhale 2 puffs into the lungs every 6 (six) hours as needed. For shortness of breath.         . ALBUTEROL SULFATE HFA 108 (90 BASE) MCG/ACT IN AERS   Inhalation   Inhale 1-2 puffs into the lungs every 6 (six) hours as needed for wheezing.   1 Inhaler   0   . FLUTICASONE PROPIONATE  HFA 110 MCG/ACT IN AERO   Inhalation  Inhale 1 puff into the lungs 2 (two) times daily.   1 Inhaler   12   . NAPROXEN 500 MG PO TABS   Oral   Take 1 tablet (500 mg total) by mouth 2 (two) times daily.   10 tablet   0     BP 108/65  Pulse 94  Temp 98.7 F (37.1 C) (Oral)  Resp 16  Ht 5\' 10"  (1.778 m)  Wt 180 lb (81.647 kg)  BMI 25.83 kg/m2  SpO2 100%  Physical Exam  Nursing note and vitals reviewed. Constitutional: She is oriented to person, place, and time. She appears well-developed and well-nourished.  HENT:  Head: Normocephalic and atraumatic.  Musculoskeletal: She exhibits tenderness.       Healing laceration 5th finger  Neurological: She is alert and oriented to person, place, and time. She has normal reflexes.  Skin: Skin is warm.  Psychiatric: She has a normal mood and affect.    ED Course  Procedures (including critical care time)  Labs Reviewed - No data to display No results found.   No diagnosis found.    MDM   Pt advised continue ibuprofen for discomfort,  Return if any problems.      Lonia Skinner [place], [place] 06/30/12 [phone]

## 2012-07-02 NOTE — ED Provider Notes (Signed)
Medical screening examination/treatment/procedure(s) were performed by non-physician practitioner and as supervising physician I was immediately available for consultation/collaboration.   Briannie Gutierrez Y. Jahseh Lucchese, MD 07/02/12 2108 

## 2013-07-06 ENCOUNTER — Emergency Department (HOSPITAL_BASED_OUTPATIENT_CLINIC_OR_DEPARTMENT_OTHER)
Admission: EM | Admit: 2013-07-06 | Discharge: 2013-07-06 | Disposition: A | Discharge status: Home / Self Care | Payer: Commercial Managed Care - PPO | Attending: Emergency Medicine | Admitting: Emergency Medicine

## 2013-07-06 ENCOUNTER — Encounter (HOSPITAL_BASED_OUTPATIENT_CLINIC_OR_DEPARTMENT_OTHER): Payer: Self-pay | Admitting: Emergency Medicine

## 2013-07-06 DIAGNOSIS — J45909 Unspecified asthma, uncomplicated: Secondary | ICD-10-CM | POA: Insufficient documentation

## 2013-07-06 DIAGNOSIS — M545 Low back pain, unspecified: Secondary | ICD-10-CM | POA: Insufficient documentation

## 2013-07-06 DIAGNOSIS — Z3202 Encounter for pregnancy test, result negative: Secondary | ICD-10-CM | POA: Insufficient documentation

## 2013-07-06 DIAGNOSIS — F17200 Nicotine dependence, unspecified, uncomplicated: Secondary | ICD-10-CM | POA: Insufficient documentation

## 2013-07-06 DIAGNOSIS — IMO0002 Reserved for concepts with insufficient information to code with codable children: Secondary | ICD-10-CM | POA: Insufficient documentation

## 2013-07-06 DIAGNOSIS — Z79899 Other long term (current) drug therapy: Secondary | ICD-10-CM | POA: Insufficient documentation

## 2013-07-06 LAB — URINALYSIS, ROUTINE W REFLEX MICROSCOPIC
Bilirubin Urine: NEGATIVE
Glucose, UA: NEGATIVE mg/dL
Hgb urine dipstick: NEGATIVE
Ketones, ur: NEGATIVE mg/dL
Protein, ur: NEGATIVE mg/dL

## 2013-07-06 LAB — PREGNANCY, URINE: Preg Test, Ur: NEGATIVE

## 2013-07-06 MED ORDER — NAPROXEN 250 MG PO TABS
500.0000 mg | ORAL_TABLET | Freq: Once | ORAL | Status: AC
  Administered 2013-07-06: 500 mg via ORAL
  Filled 2013-07-06: qty 2

## 2013-07-06 MED ORDER — HYDROCODONE-ACETAMINOPHEN 5-325 MG PO TABS
1.0000 | ORAL_TABLET | Freq: Once | ORAL | Status: AC
  Administered 2013-07-06: 1 via ORAL
  Filled 2013-07-06: qty 1

## 2013-07-06 MED ORDER — NAPROXEN SODIUM 550 MG PO TABS: ORAL_TABLET | ORAL | Status: DC

## 2013-07-06 MED ORDER — HYDROCODONE-ACETAMINOPHEN 5-325 MG PO TABS: 1.0000 | ORAL_TABLET | Freq: Four times a day (QID) | ORAL | Status: AC | PRN

## 2013-07-06 NOTE — ED Provider Notes (Signed)
CSN: [id number]     Arrival date & time 07/06/13  0341 History   First MD Initiated Contact with Patient 07/06/13 [phone]     Chief Complaint  Patient presents with  . Back Pain   (Consider location/radiation/quality/duration/timing/severity/associated sxs/prior Treatment) HPI This is a 22 year old female who complains of a sharp right paralumbar pain that has been present for several months but has worsened over the past several weeks. It is severe enough to keep her awake at night. She describes it is "extremely bad". It is worse with palpation but not with ambulation. There is no associated numbness or weakness. She denies any urinary changes.  Past Medical History  Diagnosis Date  . Asthma    Past Surgical History  Procedure Laterality Date  . Eye surgery     No family history on file. History  Substance Use Topics  . Smoking status: Current Every Day Smoker  . Smokeless tobacco: Never Used  . Alcohol Use: No     Comment: occasional    OB History   Grav Para Term Preterm Abortions TAB SAB Ect Mult Living                 Review of Systems  All other systems reviewed and are negative.    Allergies  Review of patient's allergies indicates no known allergies.  Home Medications   Current Outpatient Rx  Name  Route  Sig  Dispense  Refill  . acyclovir (ZOVIRAX) 400 MG tablet   Oral   Take 1 tablet (400 mg total) by mouth 3 (three) times daily.   15 tablet   0   . albuterol (PROVENTIL HFA;VENTOLIN HFA) 108 (90 BASE) MCG/ACT inhaler   Inhalation   Inhale 2 puffs into the lungs every 6 (six) hours as needed. For shortness of breath.         Marland Kitchen albuterol (PROVENTIL HFA;VENTOLIN HFA) 108 (90 BASE) MCG/ACT inhaler   Inhalation   Inhale 1-2 puffs into the lungs every 6 (six) hours as needed for wheezing.   1 Inhaler   0   . fluticasone (FLOVENT HFA) 110 MCG/ACT inhaler   Inhalation   Inhale 1 puff into the lungs 2 (two) times daily.   1 Inhaler   12    BP  140/69  Pulse 91  Temp(Src) 98.3 F (36.8 C) (Oral)  Resp 16  Ht 5\' 10"  (1.778 m)  Wt 200 lb (90.719 kg)  BMI 28.70 kg/m2  SpO2 100%  LMP 06/22/2013  Physical Exam General: Well-developed, well-nourished female in no acute distress; appearance consistent with age of record HENT: normocephalic; atraumatic Eyes: pupils equal, round and reactive to light; extraocular muscles intact Neck: supple Heart: regular rate and rhythm Lungs: clear to auscultation bilaterally Abdomen: soft; nondistended Back: Right paralumbar tenderness; pain reproduced with left straight leg raise at 45 but not right straight leg raise Extremities: No deformity; full range of motion; pulses normal Neurologic: Awake, alert and oriented; motor function intact in all extremities and symmetric; no facial droop Skin: Warm and dry Psychiatric: Normal mood and affect    ED Course  Procedures (including critical care time)    MDM   Nursing notes and vitals signs, including pulse oximetry, reviewed.  Summary of this visit's results, reviewed by myself:  Labs:  Results for orders placed during the hospital encounter of 07/06/13 (from the past 24 hour(s))  URINALYSIS, ROUTINE W REFLEX MICROSCOPIC     Status: None   Collection Time    07/06/13  4:48 AM      Result Value Range   Color, Urine YELLOW  YELLOW   APPearance CLEAR  CLEAR   Specific Gravity, Urine 1.025  1.005 - 1.030   pH 7.0  5.0 - 8.0   Glucose, UA NEGATIVE  NEGATIVE mg/dL   Hgb urine dipstick NEGATIVE  NEGATIVE   Bilirubin Urine NEGATIVE  NEGATIVE   Ketones, ur NEGATIVE  NEGATIVE mg/dL   Protein, ur NEGATIVE  NEGATIVE mg/dL   Urobilinogen, UA 0.2  0.0 - 1.0 mg/dL   Nitrite NEGATIVE  NEGATIVE   Leukocytes, UA NEGATIVE  NEGATIVE  PREGNANCY, URINE     Status: None   Collection Time    07/06/13  4:48 AM      Result Value Range   Preg Test, Ur NEGATIVE  NEGATIVE          Hanley Seamen, MD 07/06/13 [phone]

## 2013-07-06 NOTE — ED Notes (Signed)
MD at bedside. 

## 2013-07-06 NOTE — ED Notes (Addendum)
C/o lower back pain that started one year ago but states worse over the past couple weeks. Denies any injury. Describes as a sharp pain that comes and goes. Denies any urinary symptoms. Denies fevers

## 2013-07-07 ENCOUNTER — Ambulatory Visit: Payer: Self-pay | Admitting: Family Medicine

## 2014-07-31 ENCOUNTER — Emergency Department (HOSPITAL_BASED_OUTPATIENT_CLINIC_OR_DEPARTMENT_OTHER)
Admission: EM | Admit: 2014-07-31 | Discharge: 2014-07-31 | Disposition: A | Discharge status: Home / Self Care | Payer: No Typology Code available for payment source | Attending: Emergency Medicine | Admitting: Emergency Medicine

## 2014-07-31 ENCOUNTER — Encounter (HOSPITAL_BASED_OUTPATIENT_CLINIC_OR_DEPARTMENT_OTHER): Payer: Self-pay | Admitting: Emergency Medicine

## 2014-07-31 DIAGNOSIS — H109 Unspecified conjunctivitis: Secondary | ICD-10-CM | POA: Diagnosis not present

## 2014-07-31 DIAGNOSIS — Z7951 Long term (current) use of inhaled steroids: Secondary | ICD-10-CM | POA: Diagnosis not present

## 2014-07-31 DIAGNOSIS — H578 Other specified disorders of eye and adnexa: Secondary | ICD-10-CM | POA: Diagnosis present

## 2014-07-31 DIAGNOSIS — Z720 Tobacco use: Secondary | ICD-10-CM | POA: Diagnosis not present

## 2014-07-31 DIAGNOSIS — J45909 Unspecified asthma, uncomplicated: Secondary | ICD-10-CM | POA: Diagnosis not present

## 2014-07-31 DIAGNOSIS — Z79899 Other long term (current) drug therapy: Secondary | ICD-10-CM | POA: Diagnosis not present

## 2014-07-31 DIAGNOSIS — H5441 Blindness, right eye, normal vision left eye: Secondary | ICD-10-CM | POA: Diagnosis not present

## 2014-07-31 HISTORY — DX: Blindness, one eye, unspecified eye: H54.40

## 2014-07-31 HISTORY — DX: Unspecified amblyopia, unspecified eye: H53.009

## 2014-07-31 MED ORDER — CIPROFLOXACIN HCL 0.3 % OP SOLN
1.0000 [drp] | OPHTHALMIC | Status: DC
Start: 2014-07-31 — End: 2014-07-31
  Administered 2014-07-31: 2 [drp] via OPHTHALMIC
  Filled 2014-07-31: qty 2.5

## 2014-07-31 MED ORDER — TETRACAINE HCL 0.5 % OP SOLN
OPHTHALMIC | Status: AC
  Administered 2014-07-31: 1 [drp]
  Filled 2014-07-31: qty 2

## 2014-07-31 MED ORDER — FLUORESCEIN SODIUM 1 MG OP STRP
ORAL_STRIP | OPHTHALMIC | Status: AC
  Administered 2014-07-31: 04:00:00
  Filled 2014-07-31: qty 1

## 2014-07-31 NOTE — ED Notes (Signed)
Pt felt like there was some irritation to corner of left eye three days ago, developed redness, pain since wed. States that she feels like her eye is going to pop out, pain worse when she awakens in the am

## 2014-07-31 NOTE — ED Provider Notes (Signed)
CSN: [id number]     Arrival date & time 07/31/14  [id number] History   First MD Initiated Contact with Patient 07/31/14 [phone]     Chief Complaint  Patient presents with  . 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 She is here with a three-day history of redness, foreign body sensation and exudate of the left eye. She states that in the mornings her eyes glued shut due to discharge. She denies injury to the left eye. Her vision is blurred in the left eye. Symptoms are moderate. There are no specific exacerbating or mitigating factors. He does not wear contact lenses.  Past Medical History  Diagnosis Date  . Asthma   . Lazy eye   . 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 Eye surgery    . 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 No pertinent family history. History  Substance Use Topics  . Smoking status: Current Every Day Smoker  . Smokeless tobacco: Never Used  . Alcohol Use: No     Comment: occasional    OB History    No data available     Review of Systems  All other systems reviewed and are negative.   Allergies  Review of patient's allergies indicates no known allergies.  Home Medications   Prior to Admission medications   Medication Sig Start Date End Date Taking? Authorizing Provider  HYDROcodone-acetaminophen (NORCO/VICODIN) 5-325 MG per tablet Take 1-2 tablets by mouth every 6 (six) hours as needed for moderate pain. 07/06/13  Yes Carlisle BeersJohn L Levis Nazir, MD  Naphazoline-Glycerin (REDNESS RELIEF OP) Apply to eye.   Yes Historical Provider, MD  acyclovir (ZOVIRAX) 400 MG tablet Take 1 tablet (400 mg total) by mouth 3 (three) times  daily. 06/04/12   April K Palumbo-Rasch, MD  albuterol (PROVENTIL HFA;VENTOLIN HFA) 108 (90 BASE) MCG/ACT inhaler Inhale 2 puffs into the lungs every 6 (six) hours as needed. For shortness of breath.    Historical Provider, MD  albuterol (PROVENTIL HFA;VENTOLIN HFA) 108 (90 BASE) MCG/ACT inhaler Inhale 1-2 puffs into the lungs every 6 (six) hours as needed for wheezing. 04/19/12   April K Palumbo-Rasch, MD  fluticasone (FLOVENT HFA) 110 MCG/ACT inhaler Inhale 1 puff into the lungs 2 (two) times daily. 04/19/12   April K Palumbo-Rasch, MD  naproxen sodium (ANAPROX DS) 550 MG tablet Take 1 tablet every 12 hours with a meal for back pain. 07/06/13   Raileigh Sabater L Adewale Pucillo, MD   BP 132/86 mmHg  Pulse 104  Temp(Src) 98.2 F (36.8 C) (Oral)  SpO2 100%  LMP 07/27/2014   Physical Exam  General: Well-developed, well-nourished female in no acute distress; appearance consistent with age of record HENT: normocephalic; atraumatic Eyes: pupils equal, round and reactive to light; extraocular muscles intact with slight right lateral strabismus; globes soft bilaterally; mild left conjunctival injection with mucoid exudate; no fluoroscein uptake left cornea; visual acuity 20/70 right eye, 20/50 left eye Neck: supple Heart: regular rate and rhythm Lungs: Normal respiratory effort and excursion Abdomen: soft; nondistended Extremities: No deformity; full range of motion Neurologic: Awake, alert and oriented; motor function intact in all extremities and symmetric; no facial droop Skin:  Warm and dry Psychiatric: Normal mood and affect    ED Course  Procedures (including critical care time)   MDM  Will treat with antibiotic drops and refer to ophthalmologist on call.     Hanley Seamen, MD 07/31/14 [phone]

## 2014-10-17 ENCOUNTER — Emergency Department (HOSPITAL_BASED_OUTPATIENT_CLINIC_OR_DEPARTMENT_OTHER)
Admission: EM | Admit: 2014-10-17 | Discharge: 2014-10-17 | Disposition: A | Discharge status: Home / Self Care | Payer: No Typology Code available for payment source | Attending: Emergency Medicine | Admitting: Emergency Medicine

## 2014-10-17 ENCOUNTER — Encounter (HOSPITAL_BASED_OUTPATIENT_CLINIC_OR_DEPARTMENT_OTHER): Payer: Self-pay | Admitting: Emergency Medicine

## 2014-10-17 DIAGNOSIS — H548 Legal blindness, as defined in USA: Secondary | ICD-10-CM | POA: Insufficient documentation

## 2014-10-17 DIAGNOSIS — J45909 Unspecified asthma, uncomplicated: Secondary | ICD-10-CM | POA: Insufficient documentation

## 2014-10-17 DIAGNOSIS — H9201 Otalgia, right ear: Secondary | ICD-10-CM | POA: Insufficient documentation

## 2014-10-17 DIAGNOSIS — Z87891 Personal history of nicotine dependence: Secondary | ICD-10-CM | POA: Insufficient documentation

## 2014-10-17 DIAGNOSIS — L089 Local infection of the skin and subcutaneous tissue, unspecified: Secondary | ICD-10-CM | POA: Insufficient documentation

## 2014-10-17 DIAGNOSIS — R51 Headache: Secondary | ICD-10-CM | POA: Insufficient documentation

## 2014-10-17 DIAGNOSIS — R519 Headache, unspecified: Secondary | ICD-10-CM

## 2014-10-17 MED ORDER — IBUPROFEN 800 MG PO TABS
800.0000 mg | ORAL_TABLET | Freq: Once | ORAL | Status: AC
  Administered 2014-10-17: 800 mg via ORAL
  Filled 2014-10-17: qty 1

## 2014-10-17 MED ORDER — IBUPROFEN 600 MG PO TABS: 600.0000 mg | ORAL_TABLET | Freq: Once | ORAL | Status: AC

## 2014-10-17 NOTE — Discharge Instructions (Signed)
She presented today with right ear pain. You had a pustule in your ear canal that was likely causing your pain as well as your headache.  General Headache Without Cause A headache is pain or discomfort felt around the head or neck area. The specific cause of a headache may not be found. There are many causes and types of headaches. A few common ones are:  Tension headaches.  Migraine headaches.  Cluster headaches.  Chronic daily headaches. HOME CARE INSTRUCTIONS   Keep all follow-up appointments with your caregiver or any specialist referral.  Only take over-the-counter or prescription medicines for pain or discomfort as directed by your caregiver.  Lie down in a dark, quiet room when you have a headache.  Keep a headache journal to find out what may trigger your migraine headaches. For example, write down:  What you eat and drink.  How much sleep you get.  Any change to your diet or medicines.  Try massage or other relaxation techniques.  Put ice packs or heat on the head and neck. Use these 3 to 4 times per day for 15 to 20 minutes each time, or as needed.  Limit stress.  Sit up straight, and do not tense your muscles.  Quit smoking if you smoke.  Limit alcohol use.  Decrease the amount of caffeine you drink, or stop drinking caffeine.  Eat and sleep on a regular schedule.  Get 7 to 9 hours of sleep, or as recommended by your caregiver.  Keep lights dim if bright lights bother you and make your headaches worse. SEEK MEDICAL CARE IF:   You have problems with the medicines you were prescribed.  Your medicines are not working.  You have a change from the usual headache.  You have nausea or vomiting. SEEK IMMEDIATE MEDICAL CARE IF:   Your headache becomes severe.  You have a fever.  You have a stiff neck.  You have loss of vision.  You have muscular weakness or loss of muscle control.  You start losing your balance or have trouble walking.  You feel  faint or pass out.  You have severe symptoms that are different from your first symptoms. MAKE SURE YOU:   Understand these instructions.  Will watch your condition.  Will get help right away if you are not doing well or get worse. Document Released: 07/02/2005 Document Revised: 09/24/2011 Document Reviewed: 07/18/2011 [facility] Patient Information 2015 [place], [place]. This information is not intended to replace advice given to you by your health care provider. Make sure you discuss any questions you have with your health care provider.

## 2014-10-17 NOTE — ED Provider Notes (Signed)
CSN: [id number]     Arrival date & time 10/17/14  0022 History   First MD Initiated Contact with Patient 10/17/14 0109     Chief Complaint  Patient presents with  . Otalgia  . Headache     (Consider location/radiation/quality/duration/timing/severity/associated sxs/prior Treatment) HPI  This is a 24 year old who presents with right otalgia and headache. Patient reports that for several weeks she has noticed that she had a knot in her right ear. Over last several days it has become acutely painful and she has developed a headache. Current headache is 8 out of 10. She denies any fevers, congestion, neck pain or stiffness. She has not taken anything at home for her pain. She's not noticed any drainage from her ear. No known history of migraines. Denies any vision changes, weakness, numbness, tingling.  Past Medical History  Diagnosis Date  . Asthma   . Lazy eye   . 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 Eye surgery    . 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 No pertinent family history. History  Substance Use Topics  . Smoking status: Former Games developer  . Smokeless tobacco: Never Used  . Alcohol Use: No     Comment: occasional    OB History    No data available     Review of Systems  Constitutional: Negative for fever.  HENT: Positive for ear pain. Negative for congestion and ear discharge.   Respiratory: Negative for cough, chest tightness and shortness of breath.   Cardiovascular: Negative for chest pain.  Gastrointestinal: Negative for nausea, vomiting and abdominal pain.  Musculoskeletal: Negative for neck pain and neck stiffness.  Neurological: Positive for headaches. Negative for weakness and numbness.  Psychiatric/Behavioral: Negative for confusion.  All other systems reviewed and are negative.     Allergies  Review of patient's allergies indicates no  known allergies.  Home Medications   Prior to Admission medications   Medication Sig Start Date End Date Taking? Authorizing Provider  HYDROcodone-acetaminophen (NORCO/VICODIN) 5-325 MG per tablet Take 1-2 tablets by mouth every 6 (six) hours as needed for moderate pain. 07/06/13   John Molpus, MD  ibuprofen (ADVIL,MOTRIN) 600 MG tablet Take 1 tablet (600 mg total) by mouth once. 10/17/14   Shon Baton, MD  Naphazoline-Glycerin (REDNESS RELIEF OP) Apply to eye.    Historical Provider, MD   BP 131/81 mmHg  Pulse 84  Temp(Src) 98.6 F (37 C)  Resp 18  LMP 09/26/2014 Physical Exam  Constitutional: She is oriented to person, place, and time. She appears well-developed and well-nourished. No distress.  HENT:  Head: Normocephalic and atraumatic.  Pustule noted just outside the right auditory canal, bilateral TMs within normal limits  Eyes: Pupils are equal, round, and reactive to light.  Neck: Normal range of motion. Neck supple.  Cardiovascular: Normal rate, regular rhythm and normal heart sounds.   Pulmonary/Chest: Effort normal and breath sounds normal. No respiratory distress. She has no wheezes.  Abdominal: Soft. There is no tenderness.  Neurological: She is alert and oriented to person, place, and time.  Skin: Skin is warm and dry.  Psychiatric: She has a normal mood and affect.  Nursing note and vitals reviewed.   ED Course  INCISION AND DRAINAGE Date/Time: 10/17/2014 1:47 AM Performed by: Shon Baton Authorized by: Shon Baton Consent: Verbal consent  obtained. Risks and benefits: risks, benefits and alternatives were discussed Consent given by: patient Indications for incision and drainage: pustule. Body area: head/neck Location details: right external ear Needle gauge: 18 Incision type: unroofing. Complexity: simple Drainage: purulent Drainage amount: moderate Patient tolerance: Patient tolerated the procedure well with no immediate complications    (including critical care time) Labs Review Labs Reviewed - No data to display  Imaging Review No results found.   EKG Interpretation None      MDM   Final diagnoses:  Otalgia, right  Pustule  Acute nonintractable headache, unspecified headache type    Patient presents with otalgia headache. Exam notable for large pustule just outside the external auditory canal on the right. This is likely the source of the patient's otalgia and headache. Patient has not taken anything for her photic headache. She is otherwise nonfocal. Low suspicion for other causes of headache including meningitis or subarachnoid hemorrhage. Patient given ibuprofen. Pustule unroofed with moderate purulent drainage.  After history, exam, and medical workup I feel the patient has been appropriately medically screened and is safe for discharge home. Pertinent diagnoses were discussed with the patient. Patient was given return precautions.   Shon Batonourtney F Kelven Flater, MD 10/17/14 [phone]

## 2014-10-17 NOTE — ED Notes (Signed)
Pt reports ear pain with possible lesion in ear x1 month became more pain over the last week and could not sleep to night. Also having headache as well.
